# Patient Record
Sex: Female | Born: 1960 | Race: Black or African American | Hispanic: No | Marital: Married | Smoking: Current every day smoker
Health system: Southern US, Community
[De-identification: ages and names within clinical notes are randomized; demographics above are authoritative.]

## PROBLEM LIST (undated history)

## (undated) DIAGNOSIS — I10 Essential (primary) hypertension: Secondary | ICD-10-CM

---

## 2019-12-18 ENCOUNTER — Encounter: Payer: Self-pay | Admitting: Internal Medicine

## 2019-12-18 ENCOUNTER — Inpatient Hospital Stay
Admission: EM | Admit: 2019-12-18 | Discharge: 2019-12-23 | DRG: 872 | Disposition: A | Discharge status: Home / Self Care | Payer: BC Managed Care – PPO | Attending: Family Medicine | Admitting: Family Medicine

## 2019-12-18 ENCOUNTER — Other Ambulatory Visit: Payer: Self-pay

## 2019-12-18 ENCOUNTER — Emergency Department: Payer: BC Managed Care – PPO

## 2019-12-18 DIAGNOSIS — A4151 Sepsis due to Escherichia coli [E. coli]: Secondary | ICD-10-CM | POA: Diagnosis not present

## 2019-12-18 DIAGNOSIS — F17210 Nicotine dependence, cigarettes, uncomplicated: Secondary | ICD-10-CM | POA: Diagnosis present

## 2019-12-18 DIAGNOSIS — Z8249 Family history of ischemic heart disease and other diseases of the circulatory system: Secondary | ICD-10-CM | POA: Diagnosis not present

## 2019-12-18 DIAGNOSIS — Z9049 Acquired absence of other specified parts of digestive tract: Secondary | ICD-10-CM

## 2019-12-18 DIAGNOSIS — I10 Essential (primary) hypertension: Secondary | ICD-10-CM | POA: Diagnosis present

## 2019-12-18 DIAGNOSIS — R6520 Severe sepsis without septic shock: Secondary | ICD-10-CM | POA: Diagnosis present

## 2019-12-18 DIAGNOSIS — A419 Sepsis, unspecified organism: Secondary | ICD-10-CM

## 2019-12-18 DIAGNOSIS — Z79899 Other long term (current) drug therapy: Secondary | ICD-10-CM | POA: Diagnosis not present

## 2019-12-18 DIAGNOSIS — K5900 Constipation, unspecified: Secondary | ICD-10-CM | POA: Diagnosis present

## 2019-12-18 DIAGNOSIS — R000 Tachycardia, unspecified: Secondary | ICD-10-CM

## 2019-12-18 DIAGNOSIS — N12 Tubulo-interstitial nephritis, not specified as acute or chronic: Secondary | ICD-10-CM | POA: Diagnosis not present

## 2019-12-18 DIAGNOSIS — N179 Acute kidney failure, unspecified: Secondary | ICD-10-CM | POA: Diagnosis present

## 2019-12-18 DIAGNOSIS — N390 Urinary tract infection, site not specified: Secondary | ICD-10-CM | POA: Diagnosis not present

## 2019-12-18 DIAGNOSIS — L299 Pruritus, unspecified: Secondary | ICD-10-CM | POA: Diagnosis not present

## 2019-12-18 DIAGNOSIS — Z20822 Contact with and (suspected) exposure to covid-19: Secondary | ICD-10-CM | POA: Diagnosis present

## 2019-12-18 HISTORY — DX: Essential (primary) hypertension: I10

## 2019-12-18 LAB — COMPREHENSIVE METABOLIC PANEL
ALT: 25 U/L (ref 0–44)
AST: 32 U/L (ref 15–41)
Albumin: 4 g/dL (ref 3.5–5.0)
Alkaline Phosphatase: 76 U/L (ref 38–126)
Anion gap: 17 — ABNORMAL HIGH (ref 5–15)
BUN: 13 mg/dL (ref 6–20)
CO2: 22 mmol/L (ref 22–32)
Calcium: 9.6 mg/dL (ref 8.9–10.3)
Chloride: 100 mmol/L (ref 98–111)
Creatinine, Ser: 1.49 mg/dL — ABNORMAL HIGH (ref 0.44–1.00)
GFR calc Af Amer: 45 mL/min — ABNORMAL LOW (ref >60)
GFR calc non Af Amer: 39 mL/min — ABNORMAL LOW (ref >60)
Glucose, Bld: 137 mg/dL — ABNORMAL HIGH (ref 70–99)
Potassium: 3.9 mmol/L (ref 3.5–5.1)
Sodium: 139 mmol/L (ref 135–145)
Total Bilirubin: 1.2 mg/dL (ref 0.3–1.2)
Total Protein: 8.4 g/dL — ABNORMAL HIGH (ref 6.5–8.1)

## 2019-12-18 LAB — LACTIC ACID, PLASMA
Lactic Acid, Venous: 2 mmol/L (ref 0.5–1.9)
Lactic Acid, Venous: 1 mmol/L (ref 0.5–1.9)

## 2019-12-18 LAB — URINALYSIS, COMPLETE (UACMP) WITH MICROSCOPIC
Bilirubin Urine: NEGATIVE
Glucose, UA: NEGATIVE mg/dL
Ketones, ur: NEGATIVE mg/dL
Nitrite: NEGATIVE
Protein, ur: 100 mg/dL — AB
RBC / HPF: >50 RBC/hpf — ABNORMAL HIGH (ref 0–5)
Specific Gravity, Urine: 1.013 (ref 1.005–1.030)
WBC, UA: >50 WBC/hpf — ABNORMAL HIGH (ref 0–5)
pH: 5 (ref 5.0–8.0)

## 2019-12-18 LAB — CBC
HCT: 45.9 % (ref 36.0–46.0)
Hemoglobin: 15.2 g/dL — ABNORMAL HIGH (ref 12.0–15.0)
MCH: 30.4 pg (ref 26.0–34.0)
MCHC: 33.1 g/dL (ref 30.0–36.0)
MCV: 91.8 fL (ref 80.0–100.0)
Platelets: 223 10*3/uL (ref 150–400)
RBC: 5 MIL/uL (ref 3.87–5.11)
RDW: 14 % (ref 11.5–15.5)
WBC: 21.3 10*3/uL — ABNORMAL HIGH (ref 4.0–10.5)
nRBC: 0 % (ref 0.0–0.2)

## 2019-12-18 LAB — LIPASE, BLOOD: Lipase: 21 U/L (ref 11–51)

## 2019-12-18 LAB — SARS CORONAVIRUS 2 BY RT PCR (HOSPITAL ORDER, PERFORMED IN ~~LOC~~ HOSPITAL LAB): SARS Coronavirus 2: NEGATIVE

## 2019-12-18 MED ORDER — SODIUM CHLORIDE 0.9 % IV SOLN: 1.0000 g | INTRAVENOUS | Status: DC

## 2019-12-18 MED ORDER — ACETAMINOPHEN 325 MG PO TABS
650.0000 mg | ORAL_TABLET | Freq: Once | ORAL | Status: AC
  Administered 2019-12-18: 650 mg via ORAL
  Filled 2019-12-18: qty 2

## 2019-12-18 MED ORDER — PHENAZOPYRIDINE HCL 100 MG PO TABS
100.0000 mg | ORAL_TABLET | Freq: Three times a day (TID) | ORAL | Status: DC
  Administered 2019-12-18 – 2019-12-23 (×15): 100 mg via ORAL
  Filled 2019-12-18 (×18): qty 1

## 2019-12-18 MED ORDER — ACETAMINOPHEN 325 MG PO TABS
650.0000 mg | ORAL_TABLET | Freq: Four times a day (QID) | ORAL | Status: DC | PRN
  Administered 2019-12-18 – 2019-12-21 (×7): 650 mg via ORAL
  Filled 2019-12-18 (×9): qty 2

## 2019-12-18 MED ORDER — ONDANSETRON HCL 4 MG/2ML IJ SOLN
4.0000 mg | Freq: Once | INTRAMUSCULAR | Status: AC
  Administered 2019-12-18: 4 mg via INTRAVENOUS
  Filled 2019-12-18: qty 2

## 2019-12-18 MED ORDER — MORPHINE SULFATE (PF) 4 MG/ML IV SOLN
4.0000 mg | Freq: Once | INTRAVENOUS | Status: AC
  Administered 2019-12-18: 4 mg via INTRAVENOUS
  Filled 2019-12-18: qty 1

## 2019-12-18 MED ORDER — ONDANSETRON HCL 4 MG PO TABS: 4.0000 mg | ORAL_TABLET | Freq: Four times a day (QID) | ORAL | Status: DC | PRN

## 2019-12-18 MED ORDER — SODIUM CHLORIDE 0.9 % IV SOLN: INTRAVENOUS | Status: DC

## 2019-12-18 MED ORDER — ACETAMINOPHEN 650 MG RE SUPP: 650.0000 mg | Freq: Four times a day (QID) | RECTAL | Status: DC | PRN

## 2019-12-18 MED ORDER — ONDANSETRON HCL 4 MG/2ML IJ SOLN: 4.0000 mg | Freq: Four times a day (QID) | INTRAMUSCULAR | Status: DC | PRN

## 2019-12-18 MED ORDER — SODIUM CHLORIDE 0.9 % IV BOLUS (SEPSIS)
1000.0000 mL | Freq: Once | INTRAVENOUS | Status: AC
  Administered 2019-12-18: 1000 mL via INTRAVENOUS

## 2019-12-18 MED ORDER — SODIUM CHLORIDE 0.9 % IV SOLN
1.0000 g | INTRAVENOUS | Status: DC
  Administered 2019-12-18: 1 g via INTRAVENOUS
  Filled 2019-12-18: qty 10

## 2019-12-18 MED ORDER — ENOXAPARIN SODIUM 40 MG/0.4ML ~~LOC~~ SOLN
40.0000 mg | SUBCUTANEOUS | Status: DC
  Administered 2019-12-18 – 2019-12-22 (×5): 40 mg via SUBCUTANEOUS
  Filled 2019-12-18 (×5): qty 0.4

## 2019-12-18 NOTE — ED Notes (Signed)
Pt attempted to obtain a second UA but unable to at this time.

## 2019-12-18 NOTE — Progress Notes (Signed)
CODE SEPSIS - PHARMACY COMMUNICATION  **Broad Spectrum Antibiotics should be administered within 1 hour of Sepsis diagnosis**  Time Code Sepsis Called/Page Received: @ 1252   Antibiotics Ordered: Ceftriaxone   Time of 1st antibiotic administration: @ 1307  Additional action taken by pharmacy: N/A   Gardner Candle, PharmD, BCPS Clinical Pharmacist 12/18/2019 1:39 PM

## 2019-12-18 NOTE — ED Notes (Signed)
Pt taken to CT.

## 2019-12-18 NOTE — ED Triage Notes (Signed)
Pt states that she has been having burning with urination since Thursday, states that she has also been having difficulty having a bm and is now having pain in her left lower abd.

## 2019-12-18 NOTE — ED Notes (Signed)
EDP at bedside  

## 2019-12-18 NOTE — H&P (Signed)
History and Physical    Anita Best WUJ:[id number] DOB: 10-28-1960 DOA: 12/18/2019  PCP: Fannie Knee, MD   Patient coming from: Home  I have personally briefly reviewed patient's old medical records in [facility]  Chief Complaint: Dysuria  HPI: Anita Best is a 59 y.o. female with medical history significant for hypertension and borderline diabetes mellitus who presents to the ER for evaluation of a 3-day history of dysuria, difficulty voiding, weakness, fever and nausea.  Patient stated that she did started having urinary frequency about 3 days prior to her presentation over the last 24 hours developed severe dysuria with inability to void.  She also complains of fever and chills but did not check a temp at home.  She states that she is unable to void small amounts due to the discomfort. She also complains of suprapubic pain which she rates a 5 x 10 in intensity at its worst.  Pain is nonradiating.  She complains of constipation but denies having any chest pain, no shortness of breath, no headache, no visual changes, no diaphoresis or palpitations. Vital signs show T-max 103.3 F, tachycardia with pulse rate of 129, blood pressure 108/59 Labs reveal sodium of 139, potassium 3.9, bicarb 22, glucose 137, BUN of 13, creatinine of 1.49, white count 21.3, hemoglobin 15.2, platelet count 223.  Lactic acid was 2.1 >> 1.0 CT renal shows the bladder is thick walled with adjacent stranding suggesting significant cystitis.  There is stranding in the left upper quadrant, thought to arise from the left kidney. There is no evidence of obstruction. This could represent sequela of underlying infection/pyelonephritis. The finding could also be seen with a recently passed stone although there is no hydronephrosis on today's study.      ED Course: Patient is a 59 year old with a history of hypertension who presents to the ER for evaluation of dysuria, frequency, suprapubic pain associated with  fever and chills.  Patient appears septic and received sepsis fluid bolus in the ER as well as a dose of Rocephin.  She will be admitted to the hospital for further evaluation.  Review of Systems: As per HPI otherwise 10 point review of systems negative.    Past Medical History:  Diagnosis Date  . Hypertension       reports that she has been smoking cigarettes. She has a 2.50 pack-year smoking history. She does not have any smokeless tobacco history on file. She reports previous alcohol use. No history on file for drug use.  No Known Allergies  Family History  Problem Relation Age of Onset  . Other Mother   . Hypertension Mother   . Hypertension Father      Prior to Admission medications   Medication Sig Start Date End Date Taking? Authorizing Provider  amLODipine (NORVASC) 5 MG tablet Take 5 mg by mouth daily. 12/07/19  Yes [provider]  lisinopril (ZESTRIL) 40 MG tablet Take 40 mg by mouth daily. 12/07/19  Yes [provider]  Multiple Vitamins-Minerals (MULTIVITAMIN WITH MINERALS) tablet Take 1 tablet by mouth daily.   Yes [provider]    Physical Exam: Vitals:   12/18/19 1057 12/18/19 1123 12/18/19 1457  BP: 120/72  (!) 108/59  Pulse: (!) 129  (!) 105  Resp: 19  16  Temp: (!) 103.3 F (39.6 C)  100.2 F (37.9 C)  TempSrc: Oral  Oral  SpO2: 97%  94%  Weight:  104.3 kg   Height:  5\' 6"  (1.676 m)  Vitals:   12/18/19 1057 12/18/19 1123 12/18/19 1457  BP: 120/72  (!) 108/59  Pulse: (!) 129  (!) 105  Resp: 19  16  Temp: (!) 103.3 F (39.6 C)  100.2 F (37.9 C)  TempSrc: Oral  Oral  SpO2: 97%  94%  Weight:  104.3 kg   Height:  5\' 6"  (1.676 m)     Constitutional: NAD, alert and oriented x 3.  Acutely ill-appearing Eyes: PERRL, lids and conjunctivae normal ENMT: Mucous membranes are dry  Neck: normal, supple, no masses, no thyromegaly Respiratory: clear to auscultation bilaterally, no wheezing, no crackles. Normal  respiratory effort. No accessory muscle use.  Cardiovascular: Tachycardic, no murmurs / rubs / gallops. No extremity edema. 2+ pedal pulses. No carotid bruits.  Abdomen: Suprapubic tenderness, no masses palpated. No hepatosplenomegaly. Bowel sounds positive.  Musculoskeletal: no clubbing / cyanosis. No joint deformity upper and lower extremities.  Skin: no rashes, lesions, ulcers.  Neurologic: No gross focal neurologic deficit. Psychiatric: Normal mood and affect.   Labs on Admission: I have personally reviewed following labs and imaging studies  CBC: Recent Labs  Lab 12/18/19 1139  WBC 21.3*  HGB 15.2*  HCT 45.9  MCV 91.8  PLT 223   Basic Metabolic Panel: Recent Labs  Lab 12/18/19 1139  NA 139  K 3.9  CL 100  CO2 22  GLUCOSE 137*  BUN 13  CREATININE 1.49*  CALCIUM 9.6   GFR: Estimated Creatinine Clearance: 50.8 mL/min (A) (by C-G formula based on SCr of 1.49 mg/dL (H)). Liver Function Tests: Recent Labs  Lab 12/18/19 1139  AST 32  ALT 25  ALKPHOS 76  BILITOT 1.2  PROT 8.4*  ALBUMIN 4.0   Recent Labs  Lab 12/18/19 1139  LIPASE 21   No results for input(s): AMMONIA in the last 168 hours. Coagulation Profile: No results for input(s): INR, PROTIME in the last 168 hours. Cardiac Enzymes: No results for input(s): CKTOTAL, CKMB, CKMBINDEX, TROPONINI in the last 168 hours. BNP (last 3 results) No results for input(s): PROBNP in the last 8760 hours. HbA1C: No results for input(s): HGBA1C in the last 72 hours. CBG: No results for input(s): GLUCAP in the last 168 hours. Lipid Profile: No results for input(s): CHOL, HDL, LDLCALC, TRIG, CHOLHDL, LDLDIRECT in the last 72 hours. Thyroid Function Tests: No results for input(s): TSH, T4TOTAL, FREET4, T3FREE, THYROIDAB in the last 72 hours. Anemia Panel: No results for input(s): VITAMINB12, FOLATE, FERRITIN, TIBC, IRON, RETICCTPCT in the last 72 hours. Urine analysis: No results found for: COLORURINE,  APPEARANCEUR, LABSPEC, PHURINE, GLUCOSEU, HGBUR, BILIRUBINUR, KETONESUR, PROTEINUR, UROBILINOGEN, NITRITE, LEUKOCYTESUR  Radiological Exams on Admission: CT Renal Stone Study  Result Date: 12/18/2019 CLINICAL DATA:  Left flank and pelvic pain.  Dysuria. EXAM: CT ABDOMEN AND PELVIS WITHOUT CONTRAST TECHNIQUE: Multidetector CT imaging of the abdomen and pelvis was performed following the standard protocol without IV contrast. COMPARISON:  None. FINDINGS: Lower chest: No acute abnormality. Hepatobiliary: No focal liver abnormality is seen. Status post cholecystectomy. No biliary dilatation. Pancreas: Unremarkable. No pancreatic ductal dilatation or surrounding inflammatory changes. Spleen: Normal in size without focal abnormality. Adrenals/Urinary Tract: The adrenal glands are normal. There is fat stranding in the left upper quadrant, likely arising from the left kidney. This finding is asymmetric to the right. No renal stones or hydronephrosis. The ureters are symmetric in caliber. No ureteral stones are identified. The bladder is poorly distended but appears thick walled with adjacent fat stranding. Stomach/Bowel: There is a fat containing mass  in the distal gastric body, likely a lipoma. The stomach is otherwise normal. The small bowel is unremarkable. Scattered colonic diverticuli are seen without diverticulitis. The appendix is normal in appearance. Vascular/Lymphatic: Calcified atherosclerosis in the nonaneurysmal aorta. No adenopathy. Reproductive: Numerous calcified fibroids in the uterus. No adnexal masses. Other: No abdominal wall hernia or abnormality. No abdominopelvic ascites. Musculoskeletal: No acute or significant osseous findings. IMPRESSION: 1. The bladder is thick walled with adjacent stranding suggesting significant cystitis. Recommend clinical correlation and correlation with urinalysis. 2. There is stranding in the left upper quadrant, thought to arise from the left kidney. There is no  evidence of obstruction. This could represent sequela of underlying infection/pyelonephritis. The finding could also be seen with a recently passed stone although there is no hydronephrosis on today's study. 3. Colonic diverticulosis without diverticulitis. 4. Calcified atherosclerosis in the nonaneurysmal aorta. 5. Fibroid uterus. 6. 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 Electronically Signed   By: Gerome Sam III M.D   On: 12/18/2019 14:09    EKG: Independently reviewed.   Assessment/Plan Principal Problem:   Sepsis (HCC) Active Problems:   Acute lower UTI   Essential hypertension    Sepsis from urinary source (POA) Patient presents to the ER for evaluation of a 3-day history of frequency of urination, dysuria with fever and chills She had a T-max of 103.32F, she was tachycardic, she has a white cell count of 21,000 and pyuria (UA is pending but urine appeared thick and milky in the ER, patient only able to void small amounts at a time due to significant dysuria), lactic acid of 2.1 Aggressive IV fluid resuscitation Place patient on Rocephin 1 g IV daily while awaiting results of urine culture We will place patient on phenazopyridine Follow-up results of blood and urine culture   Hypertension Patient is normotensive Hold all antihypertensive medications for now   DVT prophylaxis: Lovenox Code Status: Full code Family Communication: Greater than 50% of time was spent discussing plan of care with patient at the bedside.  She verbalizes understanding and agrees with the plan. Disposition Plan: Back to previous home environment Consults called: None    Franca Stakes MD [facility]     12/18/2019, 3:32 PM

## 2019-12-18 NOTE — ED Provider Notes (Signed)
[facility] Emergency Department Provider Note   ____________________________________________    I have reviewed the triage vital signs and the nursing notes.   HISTORY  Chief Complaint Dysuria     HPI Anita Best is a 59 y.o. female who presents with complaints of dysuria, fever, weakness, nausea.  Patient reports she has been having urinary frequency and developed dysuria that was moderate to severe yesterday.  Today she felt worse and felt chills as well.  She denies flank pain.  She does have some discomfort when urinating and occasionally has only able to urinate small amounts.  No history of kidney stones.  Nothing seems to make it better.  Past medical history  history of cholecystectomy     Prior to Admission medications   Not on File     Allergies Patient has no known allergies.  Family history noncontributory Social History Does not smoke, does not drink EtOH Review of Systems  Constitutional: Positive chills Eyes: No visual changes.  ENT: No sore throat. Cardiovascular: Denies chest pain. Respiratory: Denies shortness of breath. Gastrointestinal: As above Genitourinary: As above Musculoskeletal: Negative for back pain. Skin: Negative for rash. Neurological: Negative for headaches or weakness   ____________________________________________   PHYSICAL EXAM:  VITAL SIGNS: ED Triage Vitals  Enc Vitals Group     BP 12/18/19 1057 120/72     Pulse Rate 12/18/19 1057 (!) 129     Resp 12/18/19 1057 19     Temp 12/18/19 1057 (!) 103.3 F (39.6 C)     Temp Source 12/18/19 1057 Oral     SpO2 12/18/19 1057 97 %     Weight 12/18/19 1123 104.3 kg (230 lb)     Height 12/18/19 1123 1.676 m (5\' 6" )     Head Circumference --      Peak Flow --      Pain Score 12/18/19 1122 7     Pain Loc --      Pain Edu? --      Excl. in GC? --     Constitutional: Alert and oriented. No acute distress.   Nose: No  congestion/rhinnorhea.  Cardiovascular: Tachycardia, regular rhythm. Grossly normal heart sounds.  Good peripheral circulation. Respiratory: Normal respiratory effort.  No retractions. Lungs CTAB. Gastrointestinal: Soft and nontender. No distention.  No CVA tenderness.  Reassuring exam Genitourinary: deferred Musculoskeletal:   Warm and well perfused Neurologic:  Normal speech and language. No gross focal neurologic deficits are appreciated.  Skin:  Skin is warm, dry and intact. No rash noted. Psychiatric: Mood and affect are normal. Speech and behavior are normal.  ____________________________________________   LABS (all labs ordered are listed, but only abnormal results are displayed)  Labs Reviewed  COMPREHENSIVE METABOLIC PANEL - Abnormal; Notable for the following components:      Result Value   Glucose, Bld 137 (*)    Creatinine, Ser 1.49 (*)    Total Protein 8.4 (*)    GFR calc non Af Amer 39 (*)    GFR calc Af Amer 45 (*)    Anion gap 17 (*)    All other components within normal limits  CBC - Abnormal; Notable for the following components:   WBC 21.3 (*)    Hemoglobin 15.2 (*)    All other components within normal limits  LACTIC ACID, PLASMA - Abnormal; Notable for the following components:   Lactic Acid, Venous 2.0 (*)    All other components within normal limits  CULTURE, BLOOD (  ROUTINE X 2)  CULTURE, BLOOD (ROUTINE X 2)  URINE CULTURE  SARS CORONAVIRUS 2 BY RT PCR (HOSPITAL ORDER, PERFORMED IN [facility])  LIPASE, BLOOD  URINALYSIS, ROUTINE W REFLEX MICROSCOPIC  LACTIC ACID, PLASMA  URINALYSIS, COMPLETE (UACMP) WITH MICROSCOPIC   ____________________________________________  EKG None ____________________________________________  RADIOLOGY  CT renal stone study performed to rule out kidney stone ____________________________________________   PROCEDURES  Procedure(s) performed: No  Procedures   Critical Care performed:  No ____________________________________________   INITIAL IMPRESSION / ASSESSMENT AND PLAN / ED COURSE  Pertinent labs & imaging results that were available during my care of the patient were reviewed by me and considered in my medical decision making (see chart for details).  Patient presents febrile, tachycardic, normal blood pressure with complaints of dysuria.  Symptoms are suspicious for urinary tract infection, likely sepsis.  Code sepsis activated upon seeing the patient  Lab work is notable for elevated white blood cell count of 21.3 consistent with sepsis.  Lactic acid is 2.  Lipase is normal.  I have ordered IV fluids, IV morphine, IV Zofran, IV Rocephin  Patient is not diabetic.  CT scan ordered to rule out infected kidney stone.  CT scan negative for ureterolithiasis, some stranding around the left kidney suspicious for pyelonephritis.  Still attempting to get a urine from patient, she is having significant difficulty urinating, IV fluids infusing.  I discussed with the hospitalist for admission    ____________________________________________   FINAL CLINICAL IMPRESSION(S) / ED DIAGNOSES  Final diagnoses:  Sepsis without acute organ dysfunction, due to unspecified organism [facility])  Pyelonephritis        Note:  This document was prepared using Dragon voice recognition software and may include unintentional dictation errors.   Jene Every, MD 12/18/19 [phone]

## 2019-12-19 ENCOUNTER — Encounter: Payer: Self-pay | Admitting: Internal Medicine

## 2019-12-19 DIAGNOSIS — A4151 Sepsis due to Escherichia coli [E. coli]: Principal | ICD-10-CM

## 2019-12-19 DIAGNOSIS — R6520 Severe sepsis without septic shock: Secondary | ICD-10-CM

## 2019-12-19 DIAGNOSIS — N179 Acute kidney failure, unspecified: Secondary | ICD-10-CM | POA: Insufficient documentation

## 2019-12-19 LAB — BLOOD CULTURE ID PANEL (REFLEXED) - BCID2

## 2019-12-19 LAB — CBC
HCT: 40.1 % (ref 36.0–46.0)
Hemoglobin: 13.2 g/dL (ref 12.0–15.0)
MCH: 30.1 pg (ref 26.0–34.0)
MCHC: 32.9 g/dL (ref 30.0–36.0)
MCV: 91.6 fL (ref 80.0–100.0)
Platelets: 195 10*3/uL (ref 150–400)
RBC: 4.38 MIL/uL (ref 3.87–5.11)
RDW: 14 % (ref 11.5–15.5)
WBC: 21.5 10*3/uL — ABNORMAL HIGH (ref 4.0–10.5)
nRBC: 0 % (ref 0.0–0.2)

## 2019-12-19 LAB — PROTIME-INR
INR: 1.2 (ref 0.8–1.2)
Prothrombin Time: 14.6 seconds (ref 11.4–15.2)

## 2019-12-19 LAB — BASIC METABOLIC PANEL
Anion gap: 11 (ref 5–15)
BUN: 17 mg/dL (ref 6–20)
CO2: 21 mmol/L — ABNORMAL LOW (ref 22–32)
Calcium: 8.6 mg/dL — ABNORMAL LOW (ref 8.9–10.3)
Chloride: 105 mmol/L (ref 98–111)
Creatinine, Ser: 1.38 mg/dL — ABNORMAL HIGH (ref 0.44–1.00)
GFR calc Af Amer: 49 mL/min — ABNORMAL LOW (ref >60)
GFR calc non Af Amer: 42 mL/min — ABNORMAL LOW (ref >60)
Glucose, Bld: 135 mg/dL — ABNORMAL HIGH (ref 70–99)
Potassium: 3.5 mmol/L (ref 3.5–5.1)
Sodium: 137 mmol/L (ref 135–145)

## 2019-12-19 LAB — HIV ANTIBODY (ROUTINE TESTING W REFLEX): HIV Screen 4th Generation wRfx: NONREACTIVE

## 2019-12-19 LAB — PROCALCITONIN: Procalcitonin: 4.3 ng/mL

## 2019-12-19 LAB — CORTISOL-AM, BLOOD: Cortisol - AM: 24.8 ug/dL — ABNORMAL HIGH (ref 6.7–22.6)

## 2019-12-19 MED ORDER — HYDROCODONE-ACETAMINOPHEN 5-325 MG PO TABS
1.0000 | ORAL_TABLET | ORAL | Status: DC | PRN
  Administered 2019-12-19: 1 via ORAL
  Filled 2019-12-19: qty 1

## 2019-12-19 MED ORDER — SODIUM CHLORIDE 0.9 % IV SOLN: 2.0000 g | INTRAVENOUS | Status: DC

## 2019-12-19 MED ORDER — MORPHINE SULFATE (PF) 2 MG/ML IV SOLN: 2.0000 mg | INTRAVENOUS | Status: DC | PRN

## 2019-12-19 MED ORDER — AMLODIPINE BESYLATE 5 MG PO TABS
5.0000 mg | ORAL_TABLET | Freq: Every day | ORAL | Status: DC
  Administered 2019-12-19 – 2019-12-20 (×2): 5 mg via ORAL
  Filled 2019-12-19 (×2): qty 1

## 2019-12-19 MED ORDER — IPRATROPIUM-ALBUTEROL 0.5-2.5 (3) MG/3ML IN SOLN
RESPIRATORY_TRACT | Status: AC
  Filled 2019-12-19: qty 9

## 2019-12-19 MED ORDER — SODIUM CHLORIDE 0.9 % IV SOLN
2.0000 g | INTRAVENOUS | Status: DC
  Administered 2019-12-19 – 2019-12-23 (×5): 2 g via INTRAVENOUS
  Filled 2019-12-19: qty 20
  Filled 2019-12-19: qty 2
  Filled 2019-12-19: qty 20
  Filled 2019-12-19 (×2): qty 2

## 2019-12-19 NOTE — Progress Notes (Signed)
Patient ID: Anita Best, female   DOB: Nov 13, 1960, 59 y.o.   MRN: [id number] [facility] PROGRESS NOTE  Anita Best TDV:[id number] DOB: Jun 19, 1960 DOA: 12/18/2019 PCP: Anita Knee, MD  HPI/Subjective: Patient feeling better than when she came in. Had abdominal pain in the front and burning on urination. She had some chills prior to coming in. Decreased appetite and just not feeling well. Blood cultures positive for E. coli.  Objective: Vitals:   12/19/19 0911 12/19/19 0919  BP: 117/69   Pulse:    Resp:  17  Temp:    SpO2:      Intake/Output Summary (Last 24 hours) at 12/19/2019 1248 Last data filed at 12/19/2019 1030 Gross per 24 hour  Intake 1200 ml  Output --  Net 1200 ml   Filed Weights   12/18/19 1123  Weight: 104.3 kg    ROS: Review of Systems  Constitutional: Positive for chills.  Respiratory: Negative for shortness of breath.   Cardiovascular: Negative for chest pain.  Gastrointestinal: Positive for abdominal pain. Negative for diarrhea, nausea and vomiting.  Genitourinary: Positive for dysuria.   Exam: Physical Exam HENT:     Nose: No mucosal edema.     Mouth/Throat:     Pharynx: No oropharyngeal exudate.  Eyes:     General: Lids are normal.     Conjunctiva/sclera: Conjunctivae normal.     Pupils: Pupils are equal, round, and reactive to light.  Cardiovascular:     Rate and Rhythm: Regular rhythm. Tachycardia present.     Heart sounds: Normal heart sounds, S1 normal and S2 normal.  Pulmonary:     Breath sounds: No decreased breath sounds, wheezing, rhonchi or rales.  Abdominal:     Palpations: Abdomen is soft.     Tenderness: There is abdominal tenderness in the suprapubic area.  Musculoskeletal:     Right ankle: No swelling.     Left ankle: No swelling.  Skin:    General: Skin is warm.     Findings: No rash.  Neurological:     Mental Status: She is alert and oriented to person, place, and time.       Data Reviewed: Basic  Metabolic Panel: Recent Labs  Lab 12/18/19 1139 12/19/19 0727  NA 139 137  K 3.9 3.5  CL 100 105  CO2 22 21*  GLUCOSE 137* 135*  BUN 13 17  CREATININE 1.49* 1.38*  CALCIUM 9.6 8.6*   Liver Function Tests: Recent Labs  Lab 12/18/19 1139  AST 32  ALT 25  ALKPHOS 76  BILITOT 1.2  PROT 8.4*  ALBUMIN 4.0   Recent Labs  Lab 12/18/19 1139  LIPASE 21   CBC: Recent Labs  Lab 12/18/19 1139 12/19/19 0727  WBC 21.3* 21.5*  HGB 15.2* 13.2  HCT 45.9 40.1  MCV 91.8 91.6  PLT 223 195     Recent Results (from the past 240 hour(s))  Culture, blood (routine x 2)     Status: None (Preliminary result)   Collection Time: 12/18/19 11:39 AM   Specimen: BLOOD  Result Value Ref Range Status   Specimen Description BLOOD BLOOD RIGHT HAND  Final   Special Requests   Final    BOTTLES DRAWN AEROBIC AND ANAEROBIC Blood Culture results may not be optimal due to an inadequate volume of blood received in culture bottles   Culture   Final    NO GROWTH < 24 HOURS Performed at [facility], [address], [place], [place] [address]  Report Status PENDING  Incomplete  Culture, blood (routine x 2)     Status: None (Preliminary result)   Collection Time: 12/18/19 11:39 AM   Specimen: BLOOD  Result Value Ref Range Status   Specimen Description BLOOD BLOOD LEFT FOREARM  Final   Special Requests   Final    BOTTLES DRAWN AEROBIC AND ANAEROBIC Blood Culture results may not be optimal due to an excessive volume of blood received in culture bottles   Culture  Setup Time   Final    Organism ID to follow GRAM NEGATIVE RODS IN BOTH AEROBIC AND ANAEROBIC BOTTLES CRITICAL RESULT CALLED TO, READ BACK BY AND VERIFIED WITH: SCOTT HALL 12/18/19 AT 2359 HS Performed at [facility], [address] [place], [place] [address]    Culture GRAM NEGATIVE RODS  Final   Report Status PENDING  Incomplete  Blood Culture ID Panel (Reflexed)     Status: Abnormal   Collection Time: 12/18/19  11:39 AM  Result Value Ref Range Status   Enterococcus faecalis NOT DETECTED NOT DETECTED Final   Enterococcus Faecium NOT DETECTED NOT DETECTED Final   Listeria monocytogenes NOT DETECTED NOT DETECTED Final   Staphylococcus species NOT DETECTED NOT DETECTED Final   Staphylococcus aureus (BCID) NOT DETECTED NOT DETECTED Final   Staphylococcus epidermidis NOT DETECTED NOT DETECTED Final   Staphylococcus lugdunensis NOT DETECTED NOT DETECTED Final   Streptococcus species NOT DETECTED NOT DETECTED Final   Streptococcus agalactiae NOT DETECTED NOT DETECTED Final   Streptococcus pneumoniae NOT DETECTED NOT DETECTED Final   Streptococcus pyogenes NOT DETECTED NOT DETECTED Final   A.calcoaceticus-baumannii NOT DETECTED NOT DETECTED Final   Bacteroides fragilis NOT DETECTED NOT DETECTED Final   Enterobacterales DETECTED (A) NOT DETECTED Final    Comment: Enterobacterales represent a large order of gram negative bacteria, not a single organism. CRITICAL RESULT CALLED TO, READ BACK BY AND VERIFIED WITH: SCOTT HALL 12/18/19 AT 2359 HS    Enterobacter cloacae complex NOT DETECTED NOT DETECTED Final   Escherichia coli DETECTED (A) NOT DETECTED Final    Comment: CRITICAL RESULT CALLED TO, READ BACK BY AND VERIFIED WITH: SCOTT HALL 12/18/19 AT 2359 HS    Klebsiella aerogenes NOT DETECTED NOT DETECTED Final   Klebsiella oxytoca NOT DETECTED NOT DETECTED Final   Klebsiella pneumoniae NOT DETECTED NOT DETECTED Final   Proteus species NOT DETECTED NOT DETECTED Final   Salmonella species NOT DETECTED NOT DETECTED Final   Serratia marcescens NOT DETECTED NOT DETECTED Final   Haemophilus influenzae NOT DETECTED NOT DETECTED Final   Neisseria meningitidis NOT DETECTED NOT DETECTED Final   Pseudomonas aeruginosa NOT DETECTED NOT DETECTED Final   Stenotrophomonas maltophilia NOT DETECTED NOT DETECTED Final   Candida albicans NOT DETECTED NOT DETECTED Final   Candida auris NOT DETECTED NOT DETECTED Final    Candida glabrata NOT DETECTED NOT DETECTED Final   Candida krusei NOT DETECTED NOT DETECTED Final   Candida parapsilosis NOT DETECTED NOT DETECTED Final   Candida tropicalis NOT DETECTED NOT DETECTED Final   Cryptococcus neoformans/gattii NOT DETECTED NOT DETECTED Final   CTX-M ESBL NOT DETECTED NOT DETECTED Final   Carbapenem resistance IMP NOT DETECTED NOT DETECTED Final   Carbapenem resistance KPC NOT DETECTED NOT DETECTED Final   Carbapenem resistance NDM NOT DETECTED NOT DETECTED Final   Carbapenem resist OXA 48 LIKE NOT DETECTED NOT DETECTED Final   Carbapenem resistance VIM NOT DETECTED NOT DETECTED Final    Comment: Performed at [facility], [address] [place], [place] [address]  SARS Coronavirus 2 by RT PCR (hospital order, performed in [facility] hospital lab) Nasopharyngeal Nasopharyngeal Swab     Status: None   Collection Time: 12/18/19  3:00 PM   Specimen: Nasopharyngeal Swab  Result Value Ref Range Status   SARS Coronavirus 2 NEGATIVE NEGATIVE Final    Comment: (NOTE) SARS-CoV-2 target nucleic acids are NOT DETECTED.  The SARS-CoV-2 RNA is generally detectable in upper and lower respiratory specimens during the acute phase of infection. The lowest concentration of SARS-CoV-2 viral copies this assay can detect is 250 copies / mL. A negative result does not preclude SARS-CoV-2 infection and should not be used as the sole basis for treatment or other patient management decisions.  A negative result may occur with improper specimen collection / handling, submission of specimen other than nasopharyngeal swab, presence of viral mutation(s) within the areas targeted by this assay, and inadequate number of viral copies (<250 copies / mL). A negative result must be combined with clinical observations, patient history, and epidemiological information.  Fact Sheet for Patients:   BoilerBrush.com.cyhttps://www.fda.gov/media/136312/download  Fact Sheet for Healthcare  Providers: https://pope.com/https://www.fda.gov/media/136313/download  This test is not yet approved or  cleared by the [place] FDA and has been authorized for detection and/or diagnosis of SARS-CoV-2 by FDA under an Emergency Use Authorization (EUA).  This EUA will remain in effect (meaning this test can be used) for the duration of the COVID-19 declaration under Section 564(b)(1) of the Act, 21 U.S.C. section 360bbb-3(b)(1), unless the authorization is terminated or revoked sooner.  Performed at [facility], [address] [place], [place] [address]      Studies: CT Renal Stone Study  Result Date: 12/18/2019 CLINICAL DATA:  Left flank and pelvic pain.  Dysuria. EXAM: CT ABDOMEN AND PELVIS WITHOUT CONTRAST TECHNIQUE: Multidetector CT imaging of the abdomen and pelvis was performed following the standard protocol without IV contrast. COMPARISON:  None. FINDINGS: Lower chest: No acute abnormality. Hepatobiliary: No focal liver abnormality is seen. Status post cholecystectomy. No biliary dilatation. Pancreas: Unremarkable. No pancreatic ductal dilatation or surrounding inflammatory changes. Spleen: Normal in size without focal abnormality. Adrenals/Urinary Tract: The adrenal glands are normal. There is fat stranding in the left upper quadrant, likely arising from the left kidney. This finding is asymmetric to the right. No renal stones or hydronephrosis. The ureters are symmetric in caliber. No ureteral stones are identified. The bladder is poorly distended but appears thick walled with adjacent fat stranding. Stomach/Bowel: There is a fat containing mass in the distal gastric body, likely a lipoma. The stomach is otherwise normal. The small bowel is unremarkable. Scattered colonic diverticuli are seen without diverticulitis. The appendix is normal in appearance. Vascular/Lymphatic: Calcified atherosclerosis in the nonaneurysmal aorta. No adenopathy. Reproductive: Numerous calcified fibroids in the  uterus. No adnexal masses. Other: No abdominal wall hernia or abnormality. No abdominopelvic ascites. Musculoskeletal: No acute or significant osseous findings. IMPRESSION: 1. The bladder is thick walled with adjacent stranding suggesting significant cystitis. Recommend clinical correlation and correlation with urinalysis. 2. There is stranding in the left upper quadrant, thought to arise from the left kidney. There is no evidence of obstruction. This could represent sequela of underlying infection/pyelonephritis. The finding could also be seen with a recently passed stone although there is no hydronephrosis on today's study. 3. Colonic diverticulosis without diverticulitis. 4. Calcified atherosclerosis in the nonaneurysmal aorta. 5. Fibroid uterus. 6. 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 Electronically Signed  By: Gerome Sam III M.D   On: 12/18/2019 14:09    Scheduled Meds: . amLODipine  5 mg Oral Daily  . enoxaparin (LOVENOX) injection  40 mg Subcutaneous Q24H  . phenazopyridine  100 mg Oral TID WC   Continuous Infusions: . sodium chloride 75 mL/hr at 12/19/19 1239  . cefTRIAXone (ROCEPHIN)  IV Stopped (12/19/19 1030)    Assessment/Plan:  1. Severe sepsis present on admission with acute kidney injury. E. coli and blood cultures. Urine analysis also positive. Patient with fever, tachypnea and tachycardia. IV fluid hydration. IV Rocephin. 2. Acute kidney injury creatinine 1.49 on presentation. Looking back at care everywhere last year creatinine was 0.7. 3. Essential hypertension can restart Norvasc but hold lisinopril  Code Status:     Code Status Orders  (From admission, onward)         Start     Ordered   12/18/19 1436  Full code  Continuous        12/18/19 1437        Code Status History    This patient has a current code status but no historical code status.   Advance Care Planning Activity     Family Communication: Patient deferred  me calling family Disposition Plan: Status is: Inpatient  Dispo: The patient is from: Home              Anticipated d/c is to: Home.              Anticipated d/c date is: Once I get sensitivities back on blood cultures and urine cultures likely can make a disposition home.              Patient currently being treated with IV antibiotics for severe sepsis  Antibiotics:  Rocephin  Time spent: 28 minutes  [employer]

## 2019-12-19 NOTE — ED Notes (Signed)
Pt's IV accidentally came out when she was up to use bathroom- will attempt new access

## 2019-12-19 NOTE — ED Notes (Signed)
Pt given ginger ale.

## 2019-12-19 NOTE — ED Notes (Signed)
Lab at bedside

## 2019-12-19 NOTE — Progress Notes (Signed)
PHARMACY - PHYSICIAN COMMUNICATION CRITICAL VALUE ALERT - BLOOD CULTURE IDENTIFICATION (BCID)  Anita Best is an 59 y.o. female who presented to [facility] on 12/18/2019 with a chief complaint of dysuria  Assessment:  Lab reports 2 of 4 bottles w/ GNR, E Coli  Name of physician (or Provider) Contacted: Reyes Ivan NP  Current antibiotics: Rocephin 1gm  Changes to prescribed antibiotics recommended: increase Rocephin to 2gm IV q24hrs Recommendations accepted by provider  Results for orders placed or performed during the hospital encounter of 12/18/19  Blood Culture ID Panel (Reflexed) (Collected: 12/18/2019 11:39 AM)  Result Value Ref Range   Enterococcus faecalis NOT DETECTED NOT DETECTED   Enterococcus Faecium NOT DETECTED NOT DETECTED   Listeria monocytogenes NOT DETECTED NOT DETECTED   Staphylococcus species NOT DETECTED NOT DETECTED   Staphylococcus aureus (BCID) NOT DETECTED NOT DETECTED   Staphylococcus epidermidis NOT DETECTED NOT DETECTED   Staphylococcus lugdunensis NOT DETECTED NOT DETECTED   Streptococcus species NOT DETECTED NOT DETECTED   Streptococcus agalactiae NOT DETECTED NOT DETECTED   Streptococcus pneumoniae NOT DETECTED NOT DETECTED   Streptococcus pyogenes NOT DETECTED NOT DETECTED   A.calcoaceticus-baumannii NOT DETECTED NOT DETECTED   Bacteroides fragilis NOT DETECTED NOT DETECTED   Enterobacterales DETECTED (A) NOT DETECTED   Enterobacter cloacae complex NOT DETECTED NOT DETECTED   Escherichia coli DETECTED (A) NOT DETECTED   Klebsiella aerogenes NOT DETECTED NOT DETECTED   Klebsiella oxytoca NOT DETECTED NOT DETECTED   Klebsiella pneumoniae NOT DETECTED NOT DETECTED   Proteus species NOT DETECTED NOT DETECTED   Salmonella species NOT DETECTED NOT DETECTED   Serratia marcescens NOT DETECTED NOT DETECTED   Haemophilus influenzae NOT DETECTED NOT DETECTED   Neisseria meningitidis NOT DETECTED NOT DETECTED   Pseudomonas aeruginosa NOT DETECTED NOT DETECTED    Stenotrophomonas maltophilia NOT DETECTED NOT DETECTED   Candida albicans NOT DETECTED NOT DETECTED   Candida auris NOT DETECTED NOT DETECTED   Candida glabrata NOT DETECTED NOT DETECTED   Candida krusei NOT DETECTED NOT DETECTED   Candida parapsilosis NOT DETECTED NOT DETECTED   Candida tropicalis NOT DETECTED NOT DETECTED   Cryptococcus neoformans/gattii NOT DETECTED NOT DETECTED   CTX-M ESBL NOT DETECTED NOT DETECTED   Carbapenem resistance IMP NOT DETECTED NOT DETECTED   Carbapenem resistance KPC NOT DETECTED NOT DETECTED   Carbapenem resistance NDM NOT DETECTED NOT DETECTED   Carbapenem resist OXA 48 LIKE NOT DETECTED NOT DETECTED   Carbapenem resistance VIM NOT DETECTED NOT DETECTED    [name] A 12/19/2019  1:59 AM

## 2019-12-19 NOTE — ED Notes (Signed)
Report given to Jack, RN

## 2019-12-19 NOTE — ED Notes (Signed)
Pt ambulated to restroom, noted tachycardia in the 130's when ambulating. Pt requested to sit at bedside chair. Requesting pain med for abdominal discomfort. Call bell within reach.

## 2019-12-20 DIAGNOSIS — A4151 Sepsis due to Escherichia coli [E. coli]: Secondary | ICD-10-CM

## 2019-12-20 DIAGNOSIS — N179 Acute kidney failure, unspecified: Secondary | ICD-10-CM

## 2019-12-20 DIAGNOSIS — L299 Pruritus, unspecified: Secondary | ICD-10-CM

## 2019-12-20 LAB — BASIC METABOLIC PANEL
Anion gap: 13 (ref 5–15)
BUN: 12 mg/dL (ref 6–20)
CO2: 21 mmol/L — ABNORMAL LOW (ref 22–32)
Calcium: 8.6 mg/dL — ABNORMAL LOW (ref 8.9–10.3)
Chloride: 103 mmol/L (ref 98–111)
Creatinine, Ser: 0.99 mg/dL (ref 0.44–1.00)
GFR calc Af Amer: >60 mL/min (ref >60)
GFR calc non Af Amer: >60 mL/min (ref >60)
Glucose, Bld: 114 mg/dL — ABNORMAL HIGH (ref 70–99)
Potassium: 3.9 mmol/L (ref 3.5–5.1)
Sodium: 137 mmol/L (ref 135–145)

## 2019-12-20 LAB — CBC
HCT: 37.7 % (ref 36.0–46.0)
Hemoglobin: 13 g/dL (ref 12.0–15.0)
MCH: 30.6 pg (ref 26.0–34.0)
MCHC: 34.5 g/dL (ref 30.0–36.0)
MCV: 88.7 fL (ref 80.0–100.0)
Platelets: 202 10*3/uL (ref 150–400)
RBC: 4.25 MIL/uL (ref 3.87–5.11)
RDW: 14.5 % (ref 11.5–15.5)
WBC: 15.8 10*3/uL — ABNORMAL HIGH (ref 4.0–10.5)
nRBC: 0 % (ref 0.0–0.2)

## 2019-12-20 MED ORDER — DIPHENHYDRAMINE HCL 25 MG PO CAPS: 25.0000 mg | ORAL_CAPSULE | Freq: Four times a day (QID) | ORAL | Status: DC | PRN

## 2019-12-20 MED ORDER — DIPHENHYDRAMINE HCL 50 MG/ML IJ SOLN
12.5000 mg | Freq: Once | INTRAMUSCULAR | Status: AC
  Administered 2019-12-20: 12.5 mg via INTRAVENOUS
  Filled 2019-12-20: qty 1

## 2019-12-20 MED ORDER — DILTIAZEM HCL ER COATED BEADS 120 MG PO CP24
120.0000 mg | ORAL_CAPSULE | Freq: Every day | ORAL | Status: DC
  Administered 2019-12-20: 120 mg via ORAL
  Filled 2019-12-20: qty 1

## 2019-12-20 NOTE — Progress Notes (Signed)
Mobility Specialist - Progress Note   12/20/19 1210  Mobility  Activity Ambulated in hall  Level of Assistance Independent  Assistive Device None  Distance Ambulated (ft) 270 ft  Mobility Response Tolerated well  Mobility performed by Mobility specialist  $Mobility charge 1 Mobility    Pre-mobility: 101 HR, 123/71 BP, 95% SpO2 During mobility: 117 HR, 99% SpO2 Post-mobility: 102 HR, 126/84 BP, 99% SpO2   Pt was lying in bed upon arrival. Pt agreed to session. Pt denied any pain or dizziness. Pt was independent in both sit-to-stand and while ambulating. No AD was used, pt pushed IV pole. Pt ambulated a total of 270' with no c/o SOB or fatigue. No heavy breathing was noted. Overall, pt tolerated session very well. Pt was left EOB with phone/call bell in reach. Nurse entered room at end of session.    Filiberto Pinks Mobility Specialist 12/20/19, 12:15 PM

## 2019-12-20 NOTE — Progress Notes (Signed)
Patient ID: Anita SomGloria Best, female   DOB: 1960/10/22, 59 y.o.   MRN: [id number] [facility] PROGRESS NOTE  Anita SomGloria Dwan [id number] DOB: 1960/10/22 DOA: 12/18/2019 PCP: Fannie KneeZipkin, Daniella A, MD  HPI/Subjective: Patient was feeling okay. Appetite starting to come back. Still having some lower abdominal pain. No burning on urination. Was admitted with sepsis. Heart rate has been a little fast. Had fever this morning again.  Objective: Vitals:   12/20/19 1349 12/20/19 1401  BP: 99/83 128/65  Pulse: (!) 115 (!) 116  Resp:  18  Temp: 100.2 F (37.9 C) 98.6 F (37 C)  SpO2: 100% 100%    Intake/Output Summary (Last 24 hours) at 12/20/2019 1614 Last data filed at 12/20/2019 0831 Gross per 24 hour  Intake 2457 ml  Output 400 ml  Net 2057 ml   Filed Weights   12/18/19 1123  Weight: 104.3 kg    ROS: Review of Systems  Respiratory: Negative for shortness of breath.   Cardiovascular: Negative for chest pain.  Gastrointestinal: Positive for abdominal pain. Negative for nausea and vomiting.   Exam: Physical Exam HENT:     Mouth/Throat:     Pharynx: No oropharyngeal exudate.  Eyes:     General: Lids are normal.     Conjunctiva/sclera: Conjunctivae normal.     Pupils: Pupils are equal, round, and reactive to light.  Cardiovascular:     Rate and Rhythm: Regular rhythm. Tachycardia present.     Heart sounds: Normal heart sounds, S1 normal and S2 normal.  Pulmonary:     Breath sounds: No decreased breath sounds, wheezing, rhonchi or rales.  Abdominal:     Palpations: Abdomen is soft.     Tenderness: There is no abdominal tenderness.  Musculoskeletal:     Right ankle: No swelling.     Left ankle: No swelling.  Skin:    General: Skin is warm.     Findings: No rash.  Neurological:     Mental Status: She is alert and oriented to person, place, and time.       Data Reviewed: Basic Metabolic Panel: Recent Labs  Lab 12/18/19 1139 12/19/19 0727 12/20/19 0807  NA 139  137 137  K 3.9 3.5 3.9  CL 100 105 103  CO2 22 21* 21*  GLUCOSE 137* 135* 114*  BUN 13 17 12   CREATININE 1.49* 1.38* 0.99  CALCIUM 9.6 8.6* 8.6*   Liver Function Tests: Recent Labs  Lab 12/18/19 1139  AST 32  ALT 25  ALKPHOS 76  BILITOT 1.2  PROT 8.4*  ALBUMIN 4.0   Recent Labs  Lab 12/18/19 1139  LIPASE 21   CBC: Recent Labs  Lab 12/18/19 1139 12/19/19 0727 12/20/19 0807  WBC 21.3* 21.5* 15.8*  HGB 15.2* 13.2 13.0  HCT 45.9 40.1 37.7  MCV 91.8 91.6 88.7  PLT 223 195 202     Recent Results (from the past 240 hour(s))  Culture, blood (routine x 2)     Status: None (Preliminary result)   Collection Time: 12/18/19 11:39 AM   Specimen: BLOOD  Result Value Ref Range Status   Specimen Description BLOOD BLOOD RIGHT HAND  Final   Special Requests   Final    BOTTLES DRAWN AEROBIC AND ANAEROBIC Blood Culture results may not be optimal due to an inadequate volume of blood received in culture bottles   Culture   Final    NO GROWTH 2 DAYS Performed at [facility], [address] [place], [place] [address]  Report Status PENDING  Incomplete  Culture, blood (routine x 2)     Status: Abnormal (Preliminary result)   Collection Time: 12/18/19 11:39 AM   Specimen: BLOOD  Result Value Ref Range Status   Specimen Description   Final    BLOOD BLOOD LEFT FOREARM Performed at [facility], [address], [place], [place] [address]    Special Requests   Final    BOTTLES DRAWN AEROBIC AND ANAEROBIC Blood Culture results may not be optimal due to an excessive volume of blood received in culture bottles Performed at [facility], [address] [place], [place] [address]    Culture  Setup Time   Final    Organism ID to follow GRAM NEGATIVE RODS IN BOTH AEROBIC AND ANAEROBIC BOTTLES CRITICAL RESULT CALLED TO, READ BACK BY AND VERIFIED WITH: SCOTT HALL 12/18/19 AT 2359 HS Performed at [facility], [address] [place],  [place] [address]    Culture (A)  Final    ESCHERICHIA COLI SUSCEPTIBILITIES TO FOLLOW Performed at [facility], [address] [place], [place] [address]    Report Status PENDING  Incomplete  Urine culture     Status: Abnormal (Preliminary result)   Collection Time: 12/18/19 11:39 AM   Specimen: Urine, Clean Catch  Result Value Ref Range Status   Specimen Description   Final    URINE, CLEAN CATCH Performed at [facility], [address], [place], [place] [address]    Special Requests   Final    NONE Performed at [facility], [address] [place], [place] [address]    Culture >=100,000 COLONIES/mL GRAM NEGATIVE RODS (A)  Final   Report Status PENDING  Incomplete  Blood Culture ID Panel (Reflexed)     Status: Abnormal   Collection Time: 12/18/19 11:39 AM  Result Value Ref Range Status   Enterococcus faecalis NOT DETECTED NOT DETECTED Final   Enterococcus Faecium NOT DETECTED NOT DETECTED Final   Listeria monocytogenes NOT DETECTED NOT DETECTED Final   Staphylococcus species NOT DETECTED NOT DETECTED Final   Staphylococcus aureus (BCID) NOT DETECTED NOT DETECTED Final   Staphylococcus epidermidis NOT DETECTED NOT DETECTED Final   Staphylococcus lugdunensis NOT DETECTED NOT DETECTED Final   Streptococcus species NOT DETECTED NOT DETECTED Final   Streptococcus agalactiae NOT DETECTED NOT DETECTED Final   Streptococcus pneumoniae NOT DETECTED NOT DETECTED Final   Streptococcus pyogenes NOT DETECTED NOT DETECTED Final   A.calcoaceticus-baumannii NOT DETECTED NOT DETECTED Final   Bacteroides fragilis NOT DETECTED NOT DETECTED Final   Enterobacterales DETECTED (A) NOT DETECTED Final    Comment: Enterobacterales represent a large order of gram negative bacteria, not a single organism. CRITICAL RESULT CALLED TO, READ BACK BY AND VERIFIED WITH: SCOTT HALL 12/18/19 AT 2359 HS    Enterobacter cloacae complex NOT DETECTED NOT DETECTED Final   Escherichia coli  DETECTED (A) NOT DETECTED Final    Comment: CRITICAL RESULT CALLED TO, READ BACK BY AND VERIFIED WITH: SCOTT HALL 12/18/19 AT 2359 HS    Klebsiella aerogenes NOT DETECTED NOT DETECTED Final   Klebsiella oxytoca NOT DETECTED NOT DETECTED Final   Klebsiella pneumoniae NOT DETECTED NOT DETECTED Final   Proteus species NOT DETECTED NOT DETECTED Final   Salmonella species NOT DETECTED NOT DETECTED Final   Serratia marcescens NOT DETECTED NOT DETECTED Final   Haemophilus influenzae NOT DETECTED NOT DETECTED Final   Neisseria meningitidis NOT DETECTED NOT DETECTED Final   Pseudomonas aeruginosa NOT DETECTED  NOT DETECTED Final   Stenotrophomonas maltophilia NOT DETECTED NOT DETECTED Final   Candida albicans NOT DETECTED NOT DETECTED Final   Candida auris NOT DETECTED NOT DETECTED Final   Candida glabrata NOT DETECTED NOT DETECTED Final   Candida krusei NOT DETECTED NOT DETECTED Final   Candida parapsilosis NOT DETECTED NOT DETECTED Final   Candida tropicalis NOT DETECTED NOT DETECTED Final   Cryptococcus neoformans/gattii NOT DETECTED NOT DETECTED Final   CTX-M ESBL NOT DETECTED NOT DETECTED Final   Carbapenem resistance IMP NOT DETECTED NOT DETECTED Final   Carbapenem resistance KPC NOT DETECTED NOT DETECTED Final   Carbapenem resistance NDM NOT DETECTED NOT DETECTED Final   Carbapenem resist OXA 48 LIKE NOT DETECTED NOT DETECTED Final   Carbapenem resistance VIM NOT DETECTED NOT DETECTED Final    Comment: Performed at [facility], [address] [place], [place] [address]  SARS Coronavirus 2 by RT PCR (hospital order, performed in [facility] hospital lab) Nasopharyngeal Nasopharyngeal Swab     Status: None   Collection Time: 12/18/19  3:00 PM   Specimen: Nasopharyngeal Swab  Result Value Ref Range Status   SARS Coronavirus 2 NEGATIVE NEGATIVE Final    Comment: (NOTE) SARS-CoV-2 target nucleic acids are NOT DETECTED.  The SARS-CoV-2 RNA is generally detectable in upper and  lower respiratory specimens during the acute phase of infection. The lowest concentration of SARS-CoV-2 viral copies this assay can detect is 250 copies / mL. A negative result does not preclude SARS-CoV-2 infection and should not be used as the sole basis for treatment or other patient management decisions.  A negative result may occur with improper specimen collection / handling, submission of specimen other than nasopharyngeal swab, presence of viral mutation(s) within the areas targeted by this assay, and inadequate number of viral copies (<250 copies / mL). A negative result must be combined with clinical observations, patient history, and epidemiological information.  Fact Sheet for Patients:   BoilerBrush.com.cy  Fact Sheet for Healthcare Providers: https://pope.com/  This test is not yet approved or  cleared by the [place] FDA and has been authorized for detection and/or diagnosis of SARS-CoV-2 by FDA under an Emergency Use Authorization (EUA).  This EUA will remain in effect (meaning this test can be used) for the duration of the COVID-19 declaration under Section 564(b)(1) of the Act, 21 U.S.C. section 360bbb-3(b)(1), unless the authorization is terminated or revoked sooner.  Performed at [facility], [address] [place], [place] [address]       Scheduled Meds: . diltiazem  120 mg Oral QHS  . enoxaparin (LOVENOX) injection  40 mg Subcutaneous Q24H  . phenazopyridine  100 mg Oral TID WC   Continuous Infusions: . sodium chloride 75 mL/hr at 12/20/19 1404  . cefTRIAXone (ROCEPHIN)  IV 2 g (12/20/19 0829)    Assessment/Plan:   1. Severe sepsis present on admission with acute kidney injury. E. coli growing in the blood cultures. Still waiting for the sensitivities. On IV Rocephin. Patient still spiking fevers. Still with tachycardia. Continue IV fluid hydration. 2. Acute kidney injury. Creatinine  improved from 1.49 down to 0.99. 3. Patient had some itching in her hands gave Benadryl. 4. Essential hypertension switch Norvasc over to Cardizem CD to control heart rate a little bit better      Code Status:     Code Status Orders  (From admission, onward)         Start     Ordered   12/18/19  1436  Full code  Continuous        12/18/19 1437        Code Status History    This patient has a current code status but no historical code status.   Advance Care Planning Activity     Family Communication: Daughter at bedside Disposition Plan: Status is: Inpatient  Dispo: The patient is from: Home              Anticipated d/c is to: Home              Anticipated d/c date is: Potentially 12/21/2019 versus 12/22/2019 based on whether I have cultures back and whether if she is still febrile or not.              Patient currently being treated for severe sepsis with E. coli on IV antibiotics. Still spiking fever.  Antibiotics:  Rocephin  Time spent: 27 minutes.  Serenitee Fuertes [employer]  [facility] [employer]

## 2019-12-21 DIAGNOSIS — R000 Tachycardia, unspecified: Secondary | ICD-10-CM

## 2019-12-21 LAB — URINE CULTURE: Culture: >=100000 — AB

## 2019-12-21 LAB — CULTURE, BLOOD (ROUTINE X 2)

## 2019-12-21 MED ORDER — AMLODIPINE BESYLATE 5 MG PO TABS
5.0000 mg | ORAL_TABLET | Freq: Every day | ORAL | Status: DC
  Administered 2019-12-21 – 2019-12-22 (×2): 5 mg via ORAL
  Filled 2019-12-21 (×2): qty 1

## 2019-12-21 NOTE — Progress Notes (Signed)
Patient ID: Anita SomGloria Best, female   DOB: [birth year], 59 y.o.   MRN: [id number] [facility] PROGRESS NOTE  Anita Best [id number] DOB: [birth year] DOA: 12/18/2019 PCP: Fannie KneeZipkin, Daniella A, MD  HPI/Subjective: Patient had a fever this morning had chills and sweating and did not feel good.  Felt a little bit better when I saw her.  On IV Rocephin for E. coli sepsis.  Objective: Vitals:   12/21/19 0638 12/21/19 1129  BP: 123/75 (!) 146/80  Pulse: 97 (!) 51  Resp: 18   Temp: 98.3 F (36.8 C) 99.7 F (37.6 C)  SpO2: 95% 95%    Intake/Output Summary (Last 24 hours) at 12/21/2019 1527 Last data filed at 12/21/2019 1516 Gross per 24 hour  Intake 480 ml  Output 750 ml  Net -270 ml   Filed Weights   12/18/19 1123  Weight: 104.3 kg    ROS: Review of Systems  Respiratory: Negative for cough and shortness of breath.   Cardiovascular: Negative for chest pain.  Gastrointestinal: Negative for abdominal pain, nausea and vomiting.   Exam: Physical Exam HENT:     Nose: No mucosal edema.     Mouth/Throat:     Pharynx: No oropharyngeal exudate.  Eyes:     General: Lids are normal.     Conjunctiva/sclera: Conjunctivae normal.     Pupils: Pupils are equal, round, and reactive to light.  Cardiovascular:     Rate and Rhythm: Normal rate and regular rhythm.     Heart sounds: Normal heart sounds, S1 normal and S2 normal.  Pulmonary:     Breath sounds: No decreased breath sounds, wheezing, rhonchi or rales.  Abdominal:     Palpations: Abdomen is soft.     Tenderness: There is no abdominal tenderness.  Musculoskeletal:     Right ankle: No swelling.     Left ankle: No swelling.  Skin:    General: Skin is warm.     Findings: No rash.  Neurological:     Mental Status: She is alert and oriented to person, place, and time.       Data Reviewed: Basic Metabolic Panel: Recent Labs  Lab 12/18/19 1139 12/19/19 0727 12/20/19 0807  NA 139 137 137  K 3.9 3.5 3.9  CL 100 105  103  CO2 22 21* 21*  GLUCOSE 137* 135* 114*  BUN 13 17 12   CREATININE 1.49* 1.38* 0.99  CALCIUM 9.6 8.6* 8.6*   Liver Function Tests: Recent Labs  Lab 12/18/19 1139  AST 32  ALT 25  ALKPHOS 76  BILITOT 1.2  PROT 8.4*  ALBUMIN 4.0   Recent Labs  Lab 12/18/19 1139  LIPASE 21   CBC: Recent Labs  Lab 12/18/19 1139 12/19/19 0727 12/20/19 0807  WBC 21.3* 21.5* 15.8*  HGB 15.2* 13.2 13.0  HCT 45.9 40.1 37.7  MCV 91.8 91.6 88.7  PLT 223 195 202     Recent Results (from the past 240 hour(s))  Culture, blood (routine x 2)     Status: None (Preliminary result)   Collection Time: 12/18/19 11:39 AM   Specimen: BLOOD  Result Value Ref Range Status   Specimen Description BLOOD BLOOD RIGHT HAND  Final   Special Requests   Final    BOTTLES DRAWN AEROBIC AND ANAEROBIC Blood Culture results may not be optimal due to an inadequate volume of blood received in culture bottles   Culture   Final    NO GROWTH 3 DAYS Performed at [facility], [address] [place], [place] [address]    Report Status PENDING  Incomplete  Culture, blood (routine x 2)     Status: Abnormal   Collection Time: 12/18/19 11:39 AM   Specimen: BLOOD  Result Value Ref Range Status   Specimen Description   Final    BLOOD BLOOD LEFT FOREARM Performed at [facility], [address] [place], [place] [address]    Special Requests   Final    BOTTLES DRAWN AEROBIC AND ANAEROBIC Blood Culture results may not be optimal due to an excessive volume of blood received in culture bottles Performed at [facility], [address] [place], [place] [address]    Culture  Setup Time   Final    Organism ID to follow GRAM NEGATIVE RODS IN BOTH AEROBIC AND ANAEROBIC BOTTLES CRITICAL RESULT CALLED TO, READ BACK BY AND VERIFIED WITH: SCOTT HALL 12/18/19 AT 2359 HS Performed at [facility], [address] [place], [place] [address]    Culture ESCHERICHIA COLI (A)  Final   Report  Status 12/21/2019 FINAL  Final   Organism ID, Bacteria ESCHERICHIA COLI  Final      Susceptibility   Escherichia coli - MIC*    AMPICILLIN <=2 SENSITIVE Sensitive     CEFAZOLIN <=4 SENSITIVE Sensitive     CEFEPIME <=0.12 SENSITIVE Sensitive     CEFTAZIDIME <=1 SENSITIVE Sensitive     CEFTRIAXONE <=0.25 SENSITIVE Sensitive     CIPROFLOXACIN <=0.25 SENSITIVE Sensitive     GENTAMICIN <=1 SENSITIVE Sensitive     IMIPENEM <=0.25 SENSITIVE Sensitive     TRIMETH/SULFA >=320 RESISTANT Resistant     AMPICILLIN/SULBACTAM <=2 SENSITIVE Sensitive     PIP/TAZO <=4 SENSITIVE Sensitive     * ESCHERICHIA COLI  Urine culture     Status: Abnormal   Collection Time: 12/18/19 11:39 AM   Specimen: Urine, Clean Catch  Result Value Ref Range Status   Specimen Description   Final    URINE, CLEAN CATCH Performed at [facility], [address], [place], [place] [address]    Special Requests   Final    NONE Performed at [facility], [address] [place], [place] [address]    Culture >=100,000 COLONIES/mL ESCHERICHIA COLI (A)  Final   Report Status 12/21/2019 FINAL  Final   Organism ID, Bacteria ESCHERICHIA COLI (A)  Final      Susceptibility   Escherichia coli - MIC*    AMPICILLIN <=2 SENSITIVE Sensitive     CEFAZOLIN <=4 SENSITIVE Sensitive     CEFTRIAXONE <=0.25 SENSITIVE Sensitive     CIPROFLOXACIN <=0.25 SENSITIVE Sensitive     GENTAMICIN <=1 SENSITIVE Sensitive     IMIPENEM <=0.25 SENSITIVE Sensitive     NITROFURANTOIN <=16 SENSITIVE Sensitive     TRIMETH/SULFA >=320 RESISTANT Resistant     AMPICILLIN/SULBACTAM <=2 SENSITIVE Sensitive     PIP/TAZO <=4 SENSITIVE Sensitive     * >=100,000 COLONIES/mL ESCHERICHIA COLI  Blood Culture ID Panel (Reflexed)     Status: Abnormal   Collection Time: 12/18/19 11:39 AM  Result Value Ref Range Status   Enterococcus faecalis NOT DETECTED NOT DETECTED Final   Enterococcus Faecium NOT DETECTED NOT DETECTED Final   Listeria  monocytogenes NOT DETECTED NOT DETECTED Final   Staphylococcus species NOT DETECTED NOT DETECTED Final   Staphylococcus aureus (BCID) NOT DETECTED NOT DETECTED Final   Staphylococcus epidermidis NOT DETECTED NOT DETECTED Final   Staphylococcus lugdunensis NOT DETECTED NOT DETECTED Final   Streptococcus species  NOT DETECTED NOT DETECTED Final   Streptococcus agalactiae NOT DETECTED NOT DETECTED Final   Streptococcus pneumoniae NOT DETECTED NOT DETECTED Final   Streptococcus pyogenes NOT DETECTED NOT DETECTED Final   A.calcoaceticus-baumannii NOT DETECTED NOT DETECTED Final   Bacteroides fragilis NOT DETECTED NOT DETECTED Final   Enterobacterales DETECTED (A) NOT DETECTED Final    Comment: Enterobacterales represent a large order of gram negative bacteria, not a single organism. CRITICAL RESULT CALLED TO, READ BACK BY AND VERIFIED WITH: SCOTT HALL 12/18/19 AT 2359 HS    Enterobacter cloacae complex NOT DETECTED NOT DETECTED Final   Escherichia coli DETECTED (A) NOT DETECTED Final    Comment: CRITICAL RESULT CALLED TO, READ BACK BY AND VERIFIED WITH: SCOTT HALL 12/18/19 AT 2359 HS    Klebsiella aerogenes NOT DETECTED NOT DETECTED Final   Klebsiella oxytoca NOT DETECTED NOT DETECTED Final   Klebsiella pneumoniae NOT DETECTED NOT DETECTED Final   Proteus species NOT DETECTED NOT DETECTED Final   Salmonella species NOT DETECTED NOT DETECTED Final   Serratia marcescens NOT DETECTED NOT DETECTED Final   Haemophilus influenzae NOT DETECTED NOT DETECTED Final   Neisseria meningitidis NOT DETECTED NOT DETECTED Final   Pseudomonas aeruginosa NOT DETECTED NOT DETECTED Final   Stenotrophomonas maltophilia NOT DETECTED NOT DETECTED Final   Candida albicans NOT DETECTED NOT DETECTED Final   Candida auris NOT DETECTED NOT DETECTED Final   Candida glabrata NOT DETECTED NOT DETECTED Final   Candida krusei NOT DETECTED NOT DETECTED Final   Candida parapsilosis NOT DETECTED NOT DETECTED Final   Candida  tropicalis NOT DETECTED NOT DETECTED Final   Cryptococcus neoformans/gattii NOT DETECTED NOT DETECTED Final   CTX-M ESBL NOT DETECTED NOT DETECTED Final   Carbapenem resistance IMP NOT DETECTED NOT DETECTED Final   Carbapenem resistance KPC NOT DETECTED NOT DETECTED Final   Carbapenem resistance NDM NOT DETECTED NOT DETECTED Final   Carbapenem resist OXA 48 LIKE NOT DETECTED NOT DETECTED Final   Carbapenem resistance VIM NOT DETECTED NOT DETECTED Final    Comment: Performed at [facility], [address] [place], [place] [address]  SARS Coronavirus 2 by RT PCR (hospital order, performed in [facility] hospital lab) Nasopharyngeal Nasopharyngeal Swab     Status: None   Collection Time: 12/18/19  3:00 PM   Specimen: Nasopharyngeal Swab  Result Value Ref Range Status   SARS Coronavirus 2 NEGATIVE NEGATIVE Final    Comment: (NOTE) SARS-CoV-2 target nucleic acids are NOT DETECTED.  The SARS-CoV-2 RNA is generally detectable in upper and lower respiratory specimens during the acute phase of infection. The lowest concentration of SARS-CoV-2 viral copies this assay can detect is 250 copies / mL. A negative result does not preclude SARS-CoV-2 infection and should not be used as the sole basis for treatment or other patient management decisions.  A negative result may occur with improper specimen collection / handling, submission of specimen other than nasopharyngeal swab, presence of viral mutation(s) within the areas targeted by this assay, and inadequate number of viral copies (<250 copies / mL). A negative result must be combined with clinical observations, patient history, and epidemiological information.  Fact Sheet for Patients:   BoilerBrush.com.cy  Fact Sheet for Healthcare Providers: https://pope.com/  This test is not yet approved or  cleared by the [place] FDA and has been authorized for detection and/or diagnosis  of SARS-CoV-2 by FDA under an Emergency Use Authorization (EUA).  This EUA will remain in effect (meaning this test can be used)  for the duration of the COVID-19 declaration under Section 564(b)(1) of the Act, 21 U.S.C. section 360bbb-3(b)(1), unless the authorization is terminated or revoked sooner.  Performed at [facility], [address] [place], [place] [address]       Scheduled Meds: . diltiazem  120 mg Oral QHS  . enoxaparin (LOVENOX) injection  40 mg Subcutaneous Q24H  . phenazopyridine  100 mg Oral TID WC   Continuous Infusions: . sodium chloride 75 mL/hr at 12/21/19 0526  . cefTRIAXone (ROCEPHIN)  IV 2 g (12/21/19 0831)    Assessment/Plan:  1. Severe sepsis present on admission with acute kidney injury.  E. coli growing out of blood cultures.  Had a fever of 102 this morning.  Would like to see the temperature curve trend better prior to disposition.  Hopefully tomorrow. 2. Acute kidney injury.  Creatinine improved from 1.49 to down to 0.99.  Holding lisinopril. 3. Itching of her hands yesterday improved with Benadryl 4. Essential hypertension and tachycardia.  We will switch her blood pressure medication back to her original Norvasc.      Code Status:     Code Status Orders  (From admission, onward)         Start     Ordered   12/18/19 1436  Full code  Continuous        12/18/19 1437        Code Status History    This patient has a current code status but no historical code status.   Advance Care Planning Activity     Disposition Plan: Status is: Inpatient  Dispo: The patient is from: Home              Anticipated d/c is to: Home              Anticipated d/c date is: Potentially 12/22/2019 if not having high fevers.              Patient currently being treated for severe sepsis with IV antibiotics.  Antibiotics:  IV Rocephin  Time spent: 26 minutes  [employer]

## 2019-12-21 NOTE — Progress Notes (Signed)
Mobility Specialist - Progress Note   12/21/19 1204  Mobility  Activity Refused mobility  Mobility performed by Mobility specialist     Pt refused mobility session this AM d/t not "feeling well this morning" and feeling "foggy". Pt stated she "didn't have a good night last night", and "didn't eat this morning". Will attempt at a later date/time.    Wilber Fini Mobility Specialist  12/21/19, 12:06 PM

## 2019-12-22 LAB — CBC
HCT: 37.1 % (ref 36.0–46.0)
Hemoglobin: 12.7 g/dL (ref 12.0–15.0)
MCH: 29.7 pg (ref 26.0–34.0)
MCHC: 34.2 g/dL (ref 30.0–36.0)
MCV: 86.7 fL (ref 80.0–100.0)
Platelets: 263 10*3/uL (ref 150–400)
RBC: 4.28 MIL/uL (ref 3.87–5.11)
RDW: 14.3 % (ref 11.5–15.5)
WBC: 13.3 10*3/uL — ABNORMAL HIGH (ref 4.0–10.5)
nRBC: 0 % (ref 0.0–0.2)

## 2019-12-22 LAB — BASIC METABOLIC PANEL
Anion gap: 10 (ref 5–15)
BUN: 8 mg/dL (ref 6–20)
CO2: 25 mmol/L (ref 22–32)
Calcium: 8.7 mg/dL — ABNORMAL LOW (ref 8.9–10.3)
Chloride: 103 mmol/L (ref 98–111)
Creatinine, Ser: 0.78 mg/dL (ref 0.44–1.00)
GFR calc Af Amer: >60 mL/min (ref >60)
GFR calc non Af Amer: >60 mL/min (ref >60)
Glucose, Bld: 111 mg/dL — ABNORMAL HIGH (ref 70–99)
Potassium: 3.5 mmol/L (ref 3.5–5.1)
Sodium: 138 mmol/L (ref 135–145)

## 2019-12-22 NOTE — Progress Notes (Addendum)
No fevers have been reported at this time. No falls. No pain reported. IV antibiotics provided. IV antibiotics restarted as they had to be stopped due to IV infiltration.

## 2019-12-22 NOTE — Progress Notes (Signed)
Mobility Specialist - Progress Note   12/22/19 1248  Mobility  Activity Ambulated in room;Ambulated in hall  Level of Assistance Independent  Assistive Device None  Distance Ambulated (ft) 550 ft  Mobility Response Tolerated well  Mobility performed by Mobility specialist  $Mobility charge 1 Mobility    During mobility: 103 HR, 95% SpO2   Pt standing bedside upon arrival. Pt agreed to mobility session. Pt states she "is ready to go home" and feels "fine". Pt ambulated 550' total in room and hallway independently. Pt had no complaints during session. She tolerated session very well. Pt left sitting EOB eating lunch.    Havah Ammon Mobility Specialist  12/22/19, 12:56 PM

## 2019-12-22 NOTE — Progress Notes (Signed)
PROGRESS NOTE    Anita Best  GQB:[id number] DOB: Nov 11, 1960 DOA: 12/18/2019 PCP: Fannie Knee, MD    Brief Narrative:  Anita Best is a 59 y.o. female with medical history significant for hypertension and borderline diabetes mellitus who presents to the ER for evaluation of a 3-day history of dysuria, difficulty voiding, weakness, fever and nausea.  Patient stated that she did started having urinary frequency about 3 days prior to her presentation over the last 24 hours developed severe dysuria with inability to void.  She also complains of fever and chills but did not check a temp at home.  She states that she is unable to void small amounts due to the discomfort. She also complains of suprapubic pain which she rates a 5 x 10 in intensity at its worst.  Pain is nonradiating.  CT renal shows the bladder is thick walled with adjacent stranding suggesting significant cystitis.  There is stranding in the left upper quadrant, thought to arise from the left kidney. There is no evidence of obstruction. This could represent sequela of underlying infection/pyelonephritis. The finding could also be seen with a recently passed stone although there is no hydronephrosis on today's study. She is admitted for severe sepsis secondary to UTI.  Blood cultures growing E. coli ESBL she is started on IV antibiotics.  Assessment & Plan:   Principal Problem:   Severe sepsis (HCC) Active Problems:   Acute lower UTI   Essential hypertension   Sepsis due to Escherichia coli with acute renal failure without septic shock (HCC)   Itching   Tachycardia  1. Severe sepsis present on admission with acute kidney injury.  E. coli growing out of blood cultures.  Had a fever of 102  today.  Would like to see the temperature curve trend better prior to disposition.   2. Acute kidney injury. Improved.   Creatinine improved from 1.49 to down to 0.99.  Holding lisinopril. 3. Itching of her hands yesterday improved  with Benadryl 4. Essential hypertension and tachycardia.  We will switch her blood pressure medication back to her original Norvasc.   DVT prophylaxis:  Lovenox Code Status: Full Family Communication: Discussed with patient in detail. Disposition Plan:  Anticipated DC home in 1-2 days.   Consultants:   None.  Procedures:  Antimicrobials  Anti-infectives (From admission, onward)   Start     Dose/Rate Route Frequency Ordered Stop   12/19/19 1000  cefTRIAXone (ROCEPHIN) 1 g in sodium chloride 0.9 % 100 mL IVPB  Status:  Discontinued        1 g 200 mL/hr over 30 Minutes Intravenous Every 24 hours 12/18/19 1437 12/18/19 1454   12/19/19 0800  cefTRIAXone (ROCEPHIN) 2 g in sodium chloride 0.9 % 100 mL IVPB  Status:  Discontinued        2 g 200 mL/hr over 30 Minutes Intravenous Every 24 hours 12/19/19 0209 12/19/19 0733   12/19/19 0800  cefTRIAXone (ROCEPHIN) 2 g in sodium chloride 0.9 % 100 mL IVPB        2 g 200 mL/hr over 30 Minutes Intravenous Every 24 hours 12/19/19 0754     12/18/19 1300  cefTRIAXone (ROCEPHIN) 1 g in sodium chloride 0.9 % 100 mL IVPB  Status:  Discontinued        1 g 200 mL/hr over 30 Minutes Intravenous Every 24 hours 12/18/19 1250 12/19/19 0209     Subjective: Patient was seen and examined at bedside.  Overnight events noted.  She had a  fever of 100.7 in the morning.  she denies any other concerns.  Objective: Vitals:   12/21/19 1129 12/21/19 1959 12/21/19 2110 12/22/19 0718  BP: (!) 146/80 130/72  126/77  Pulse: (!) 51 (!) 103  (!) 102  Resp:  18  18  Temp: 99.7 F (37.6 C) (!) 100.7 F (38.2 C) 98.5 F (36.9 C) 99.6 F (37.6 C)  TempSrc: Oral Oral Oral Oral  SpO2: 95% 100%  99%  Weight:      Height:        Intake/Output Summary (Last 24 hours) at 12/22/2019 1345 Last data filed at 12/21/2019 1516 Gross per 24 hour  Intake --  Output 750 ml  Net -750 ml   Filed Weights   12/18/19 1123  Weight: 104.3 kg    Examination:  General exam:  Appears calm and comfortable  Respiratory system: Clear to auscultation. Respiratory effort normal. Cardiovascular system: S1 & S2 heard, RRR. No JVD, murmurs, rubs, gallops or clicks. No pedal edema. Gastrointestinal system: Abdomen is nondistended, soft and nontender. No organomegaly or masses felt. Normal bowel sounds heard. Central nervous system: Alert and oriented. No focal neurological deficits. Extremities: Symmetric 5 x 5 power. Skin: No rashes, lesions or ulcers Psychiatry: Judgement and insight appear normal. Mood & affect appropriate.     Data Reviewed: I have personally reviewed following labs and imaging studies  CBC: Recent Labs  Lab 12/18/19 1139 12/19/19 0727 12/20/19 0807 12/22/19 0602  WBC 21.3* 21.5* 15.8* 13.3*  HGB 15.2* 13.2 13.0 12.7  HCT 45.9 40.1 37.7 37.1  MCV 91.8 91.6 88.7 86.7  PLT 223 195 202 263   Basic Metabolic Panel: Recent Labs  Lab 12/18/19 1139 12/19/19 0727 12/20/19 0807 12/22/19 0602  NA 139 137 137 138  K 3.9 3.5 3.9 3.5  CL 100 105 103 103  CO2 22 21* 21* 25  GLUCOSE 137* 135* 114* 111*  BUN 13 17 12 8   CREATININE 1.49* 1.38* 0.99 0.78  CALCIUM 9.6 8.6* 8.6* 8.7*   GFR: Estimated Creatinine Clearance: 94.7 mL/min (by C-G formula based on SCr of 0.78 mg/dL). Liver Function Tests: Recent Labs  Lab 12/18/19 1139  AST 32  ALT 25  ALKPHOS 76  BILITOT 1.2  PROT 8.4*  ALBUMIN 4.0   Recent Labs  Lab 12/18/19 1139  LIPASE 21   No results for input(s): AMMONIA in the last 168 hours. Coagulation Profile: Recent Labs  Lab 12/19/19 0727  INR 1.2   Cardiac Enzymes: No results for input(s): CKTOTAL, CKMB, CKMBINDEX, TROPONINI in the last 168 hours. BNP (last 3 results) No results for input(s): PROBNP in the last 8760 hours. HbA1C: No results for input(s): HGBA1C in the last 72 hours. CBG: No results for input(s): GLUCAP in the last 168 hours. Lipid Profile: No results for input(s): CHOL, HDL, LDLCALC, TRIG,  CHOLHDL, LDLDIRECT in the last 72 hours. Thyroid Function Tests: No results for input(s): TSH, T4TOTAL, FREET4, T3FREE, THYROIDAB in the last 72 hours. Anemia Panel: No results for input(s): VITAMINB12, FOLATE, FERRITIN, TIBC, IRON, RETICCTPCT in the last 72 hours. Sepsis Labs: Recent Labs  Lab 12/18/19 1139 12/18/19 1500 12/19/19 0727  PROCALCITON  --   --  4.30  LATICACIDVEN 2.0* 1.0  --     Recent Results (from the past 240 hour(s))  Culture, blood (routine x 2)     Status: None (Preliminary result)   Collection Time: 12/18/19 11:39 AM   Specimen: BLOOD  Result Value Ref Range Status   Specimen  Description BLOOD BLOOD RIGHT HAND  Final   Special Requests   Final    BOTTLES DRAWN AEROBIC AND ANAEROBIC Blood Culture results may not be optimal due to an inadequate volume of blood received in culture bottles   Culture   Final    NO GROWTH 4 DAYS Performed at [facility], [address] [place], [place] [address]    Report Status PENDING  Incomplete  Culture, blood (routine x 2)     Status: Abnormal   Collection Time: 12/18/19 11:39 AM   Specimen: BLOOD  Result Value Ref Range Status   Specimen Description   Final    BLOOD BLOOD LEFT FOREARM Performed at [facility], [address], [place], [place] [address]    Special Requests   Final    BOTTLES DRAWN AEROBIC AND ANAEROBIC Blood Culture results may not be optimal due to an excessive volume of blood received in culture bottles Performed at [facility], [address] [place], [place] [address]    Culture  Setup Time   Final    Organism ID to follow GRAM NEGATIVE RODS IN BOTH AEROBIC AND ANAEROBIC BOTTLES CRITICAL RESULT CALLED TO, READ BACK BY AND VERIFIED WITH: SCOTT HALL 12/18/19 AT 2359 HS Performed at [facility], [address] [place], [place] [address]    Culture ESCHERICHIA COLI (A)  Final   Report Status 12/21/2019 FINAL  Final   Organism ID, Bacteria ESCHERICHIA  COLI  Final      Susceptibility   Escherichia coli - MIC*    AMPICILLIN <=2 SENSITIVE Sensitive     CEFAZOLIN <=4 SENSITIVE Sensitive     CEFEPIME <=0.12 SENSITIVE Sensitive     CEFTAZIDIME <=1 SENSITIVE Sensitive     CEFTRIAXONE <=0.25 SENSITIVE Sensitive     CIPROFLOXACIN <=0.25 SENSITIVE Sensitive     GENTAMICIN <=1 SENSITIVE Sensitive     IMIPENEM <=0.25 SENSITIVE Sensitive     TRIMETH/SULFA >=320 RESISTANT Resistant     AMPICILLIN/SULBACTAM <=2 SENSITIVE Sensitive     PIP/TAZO <=4 SENSITIVE Sensitive     * ESCHERICHIA COLI  Urine culture     Status: Abnormal   Collection Time: 12/18/19 11:39 AM   Specimen: Urine, Clean Catch  Result Value Ref Range Status   Specimen Description   Final    URINE, CLEAN CATCH Performed at [facility], [address] [place], [place] [address]    Special Requests   Final    NONE Performed at [facility], [address] [place], [place] [address]    Culture >=100,000 COLONIES/mL ESCHERICHIA COLI (A)  Final   Report Status 12/21/2019 FINAL  Final   Organism ID, Bacteria ESCHERICHIA COLI (A)  Final      Susceptibility   Escherichia coli - MIC*    AMPICILLIN <=2 SENSITIVE Sensitive     CEFAZOLIN <=4 SENSITIVE Sensitive     CEFTRIAXONE <=0.25 SENSITIVE Sensitive     CIPROFLOXACIN <=0.25 SENSITIVE Sensitive     GENTAMICIN <=1 SENSITIVE Sensitive     IMIPENEM <=0.25 SENSITIVE Sensitive     NITROFURANTOIN <=16 SENSITIVE Sensitive     TRIMETH/SULFA >=320 RESISTANT Resistant     AMPICILLIN/SULBACTAM <=2 SENSITIVE Sensitive     PIP/TAZO <=4 SENSITIVE Sensitive     * >=100,000 COLONIES/mL ESCHERICHIA COLI  Blood Culture ID Panel (Reflexed)     Status: Abnormal   Collection Time: 12/18/19 11:39 AM  Result Value Ref Range Status   Enterococcus faecalis NOT DETECTED NOT DETECTED  Final   Enterococcus Faecium NOT DETECTED NOT DETECTED Final   Listeria monocytogenes NOT DETECTED NOT DETECTED Final   Staphylococcus species NOT  DETECTED NOT DETECTED Final   Staphylococcus aureus (BCID) NOT DETECTED NOT DETECTED Final   Staphylococcus epidermidis NOT DETECTED NOT DETECTED Final   Staphylococcus lugdunensis NOT DETECTED NOT DETECTED Final   Streptococcus species NOT DETECTED NOT DETECTED Final   Streptococcus agalactiae NOT DETECTED NOT DETECTED Final   Streptococcus pneumoniae NOT DETECTED NOT DETECTED Final   Streptococcus pyogenes NOT DETECTED NOT DETECTED Final   A.calcoaceticus-baumannii NOT DETECTED NOT DETECTED Final   Bacteroides fragilis NOT DETECTED NOT DETECTED Final   Enterobacterales DETECTED (A) NOT DETECTED Final    Comment: Enterobacterales represent a large order of gram negative bacteria, not a single organism. CRITICAL RESULT CALLED TO, READ BACK BY AND VERIFIED WITH: SCOTT HALL 12/18/19 AT 2359 HS    Enterobacter cloacae complex NOT DETECTED NOT DETECTED Final   Escherichia coli DETECTED (A) NOT DETECTED Final    Comment: CRITICAL RESULT CALLED TO, READ BACK BY AND VERIFIED WITH: SCOTT HALL 12/18/19 AT 2359 HS    Klebsiella aerogenes NOT DETECTED NOT DETECTED Final   Klebsiella oxytoca NOT DETECTED NOT DETECTED Final   Klebsiella pneumoniae NOT DETECTED NOT DETECTED Final   Proteus species NOT DETECTED NOT DETECTED Final   Salmonella species NOT DETECTED NOT DETECTED Final   Serratia marcescens NOT DETECTED NOT DETECTED Final   Haemophilus influenzae NOT DETECTED NOT DETECTED Final   Neisseria meningitidis NOT DETECTED NOT DETECTED Final   Pseudomonas aeruginosa NOT DETECTED NOT DETECTED Final   Stenotrophomonas maltophilia NOT DETECTED NOT DETECTED Final   Candida albicans NOT DETECTED NOT DETECTED Final   Candida auris NOT DETECTED NOT DETECTED Final   Candida glabrata NOT DETECTED NOT DETECTED Final   Candida krusei NOT DETECTED NOT DETECTED Final   Candida parapsilosis NOT DETECTED NOT DETECTED Final   Candida tropicalis NOT DETECTED NOT DETECTED Final   Cryptococcus neoformans/gattii  NOT DETECTED NOT DETECTED Final   CTX-M ESBL NOT DETECTED NOT DETECTED Final   Carbapenem resistance IMP NOT DETECTED NOT DETECTED Final   Carbapenem resistance KPC NOT DETECTED NOT DETECTED Final   Carbapenem resistance NDM NOT DETECTED NOT DETECTED Final   Carbapenem resist OXA 48 LIKE NOT DETECTED NOT DETECTED Final   Carbapenem resistance VIM NOT DETECTED NOT DETECTED Final    Comment: Performed at [facility], [address] [place], [place] [address]  SARS Coronavirus 2 by RT PCR (hospital order, performed in [facility] hospital lab) Nasopharyngeal Nasopharyngeal Swab     Status: None   Collection Time: 12/18/19  3:00 PM   Specimen: Nasopharyngeal Swab  Result Value Ref Range Status   SARS Coronavirus 2 NEGATIVE NEGATIVE Final    Comment: (NOTE) SARS-CoV-2 target nucleic acids are NOT DETECTED.  The SARS-CoV-2 RNA is generally detectable in upper and lower respiratory specimens during the acute phase of infection. The lowest concentration of SARS-CoV-2 viral copies this assay can detect is 250 copies / mL. A negative result does not preclude SARS-CoV-2 infection and should not be used as the sole basis for treatment or other patient management decisions.  A negative result may occur with improper specimen collection / handling, submission of specimen other than nasopharyngeal swab, presence of viral mutation(s) within the areas targeted by this assay, and inadequate number of viral copies (<250 copies / mL). A negative result must be combined with clinical observations, patient history, and epidemiological information.  Fact Sheet for Patients:   BoilerBrush.com.cyhttps://www.fda.gov/media/136312/download  Fact Sheet for Healthcare Providers: https://pope.com/https://www.fda.gov/media/136313/download  This test is not yet approved or  cleared by the [place] FDA and has been authorized for detection and/or diagnosis of SARS-CoV-2 by FDA under an Emergency Use Authorization (EUA).  This EUA  will remain in effect (meaning this test can be used) for the duration of the COVID-19 declaration under Section 564(b)(1) of the Act, 21 U.S.C. section 360bbb-3(b)(1), unless the authorization is terminated or revoked sooner.  Performed at [facility], [address] [place], [place] [address]      Radiology Studies: No results found.  Scheduled Meds: . amLODipine  5 mg Oral QHS  . enoxaparin (LOVENOX) injection  40 mg Subcutaneous Q24H  . phenazopyridine  100 mg Oral TID WC   Continuous Infusions: . sodium chloride 75 mL/hr at 12/22/19 0719  . cefTRIAXone (ROCEPHIN)  IV 2 g (12/22/19 0817)     LOS: 4 days    Time spent:  25 mins.    Cipriano BunkerPARDEEP Edris Friedt, MD [facility]   If 7PM-7AM, please contact night-coverage

## 2019-12-23 LAB — CULTURE, BLOOD (ROUTINE X 2): Culture: NO GROWTH

## 2019-12-23 MED ORDER — LEVOFLOXACIN 750 MG PO TABS: 750.0000 mg | ORAL_TABLET | Freq: Every day | ORAL | 0 refills | Status: AC

## 2019-12-23 MED ORDER — LEVOFLOXACIN 750 MG PO TABS: 750.0000 mg | ORAL_TABLET | Freq: Every day | ORAL | 0 refills | Status: DC

## 2019-12-23 NOTE — Progress Notes (Signed)
Discharge instructions reviewed with patient including followup visits and new medications.  Understanding was verbalized and all questions were answered.  IV removed without complication; patient tolerated well.  Patient discharged home ambulatory in stable condition. 

## 2019-12-23 NOTE — Discharge Summary (Signed)
Physician Discharge Summary  Anita Best LKG:[id number] DOB: 1960-12-04 DOA: 12/18/2019  PCP: Fannie Knee, MD  Admit date: 12/18/2019  Discharge date: 12/23/2019  Admitted From:  Home.  Disposition:  Home   Recommendations for Outpatient Follow-up:  Follow up with PCP in 1-2 weeks. Please obtain BMP/CBC in one week. Advised to take Levaquin 750 mg daily for 5 days staring 12/24/2019.  Home Health:  None. Equipment/Devices: None. Discharge Condition: Stable CODE STATUS:Full code Diet recommendation: Heart Healthy  Dysphagia   Brief Summary / Hospital course: Anita Best is a 59 y.o. female with medical history significant for hypertension and borderline diabetes mellitus who presents to the ER for evaluation of a 3-day history of dysuria, difficulty voiding, weakness, fever and nausea.  Patient stated that she started having urinary frequency about 3 days prior to her presentation over the last 24 hours developed severe dysuria with inability to void.  She also complains of fever and chills but did not check a temp at home.  She states that she is unable to void small amounts due to the discomfort. She also complains of suprapubic pain which she rates a 5 x 10 in intensity at its worst.  Pain is nonradiating.  CT renal shows the bladder is thick walled with adjacent stranding suggesting significant cystitis. There is stranding in the left upper quadrant, thought to arise from the left kidney. There is no evidence of obstruction. This could represent sequela of underlying infection/pyelonephritis. The finding could also be seen with a recently passed stone although there is no hydronephrosis on today's study.  She was admitted for severe sepsis secondary to UTI.  Blood cultures growing Enterobacter , urine culture positive for E coli pan senitive.  she is started on IV antibiotics. She remained on ceftriaxone and she has taken ceftriaxone for 5 days.  She continues to have a fever, one  of the blood culture shows Enterobacter,  urine culture grew E. coli which was pansensitive.  Discussed with ID specialist , recommended patient can be discharged on Levaquin for 5 more days.  She remained afebrile for 24 hours before discharge.  Patient feels better and wants to be discharged.  Patient is being discharged home on Levaquin 750 mg for 5 more days.  Advised to follow-up with primary care physician in one week.  She was managed for below problems.  Discharge Diagnoses:  Principal Problem:   Severe sepsis (HCC) Active Problems:   Acute lower UTI   Essential hypertension   Sepsis due to Escherichia coli with acute renal failure without septic shock (HCC)   Itching   Tachycardia  Severe sepsis present on admission with acute kidney injury. >>> E. coli growing out of blood cultures.  Had a fever of 102  8/24.    Would like to see the temperature curve trend better prior to disposition.    Acute kidney injury. Improved.   Creatinine improved from 1.49 to down to 0.99.  Holding lisinopril.  Itching of her hands yesterday improved with Benadryl Essential hypertension and tachycardia.  We will switch her blood pressure medication back to her original Norvasc.    Discharge Instructions  Discharge Instructions     Call MD for:  difficulty breathing, headache or visual disturbances   Complete by: As directed    Call MD for:  persistant dizziness or light-headedness   Complete by: As directed    Call MD for:  persistant nausea and vomiting   Complete by: As directed  Call MD for:  temperature >100.4   Complete by: As directed    Diet - low sodium heart healthy   Complete by: As directed    Diet Carb Modified   Complete by: As directed    Discharge instructions   Complete by: As directed    Advised to follow up PCP in one week. Advised to take Levaquin 750 mg daily for 5 days staring 12/24/2019.   Increase activity slowly   Complete by: As directed       Allergies as  of 12/23/2019   No Known Allergies      Medication List     TAKE these medications    amLODipine 5 MG tablet Commonly known as: NORVASC Take 5 mg by mouth daily.   levofloxacin 750 MG tablet Commonly known as: Levaquin Take 1 tablet (750 mg total) by mouth daily for 7 days.   lisinopril 40 MG tablet Commonly known as: ZESTRIL Take 40 mg by mouth daily.   multivitamin with minerals tablet Take 1 tablet by mouth daily.        Follow-up Information     Zipkin, Daniella A, MD Follow up in 1 week(s).   Specialty: Internal Medicine Contact information: [address] [place] [address] [phone]                No Known Allergies  Consultations: None.  Procedures/Studies: CT Renal Stone Study  Result Date: 12/18/2019 CLINICAL DATA:  Left flank and pelvic pain.  Dysuria. EXAM: CT ABDOMEN AND PELVIS WITHOUT CONTRAST TECHNIQUE: Multidetector CT imaging of the abdomen and pelvis was performed following the standard protocol without IV contrast. COMPARISON:  None. FINDINGS: Lower chest: No acute abnormality. Hepatobiliary: No focal liver abnormality is seen. Status post cholecystectomy. No biliary dilatation. Pancreas: Unremarkable. No pancreatic ductal dilatation or surrounding inflammatory changes. Spleen: Normal in size without focal abnormality. Adrenals/Urinary Tract: The adrenal glands are normal. There is fat stranding in the left upper quadrant, likely arising from the left kidney. This finding is asymmetric to the right. No renal stones or hydronephrosis. The ureters are symmetric in caliber. No ureteral stones are identified. The bladder is poorly distended but appears thick walled with adjacent fat stranding. Stomach/Bowel: There is a fat containing mass in the distal gastric body, likely a lipoma. The stomach is otherwise normal. The small bowel is unremarkable. Scattered colonic diverticuli are seen without diverticulitis. The appendix is normal in  appearance. Vascular/Lymphatic: Calcified atherosclerosis in the nonaneurysmal aorta. No adenopathy. Reproductive: Numerous calcified fibroids in the uterus. No adnexal masses. Other: No abdominal wall hernia or abnormality. No abdominopelvic ascites. Musculoskeletal: No acute or significant osseous findings. IMPRESSION: 1. The bladder is thick walled with adjacent stranding suggesting significant cystitis. Recommend clinical correlation and correlation with urinalysis. 2. There is stranding in the left upper quadrant, thought to arise from the left kidney. There is no evidence of obstruction. This could represent sequela of underlying infection/pyelonephritis. The finding could also be seen with a recently passed stone although there is no hydronephrosis on today's study. 3. Colonic diverticulosis without diverticulitis. 4. Calcified atherosclerosis in the nonaneurysmal aorta. 5. Fibroid uterus. 6. 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 Electronically Signed   By: Gerome Sam III M.D   On: 12/18/2019 14:09      Subjective: Patient was seen and examined  At bedside, no overnight events, she feels better, denies  Any fever and wants to go home.  Discharge Exam:  Vitals:   12/22/19 2022 12/23/19 0533  BP: 116/69 125/65  Pulse: 99 76  Resp: 18 18  Temp: 99.7 F (37.6 C) 98.5 F (36.9 C)  SpO2: 98% 98%   Vitals:   12/22/19 0718 12/22/19 1405 12/22/19 2022 12/23/19 0533  BP: 126/77 132/86 116/69 125/65  Pulse: (!) 102 97 99 76  Resp: 18 20 18 18   Temp: 99.6 F (37.6 C) 98.9 F (37.2 C) 99.7 F (37.6 C) 98.5 F (36.9 C)  TempSrc: Oral Oral  Oral  SpO2: 99% 98% 98% 98%  Weight:      Height:        General: Pt is alert, awake, not in acute distress Cardiovascular: RRR, S1/S2 +, no rubs, no gallops Respiratory: CTA bilaterally, no wheezing, no rhonchi Abdominal: Soft, NT, ND, bowel sounds + Extremities: no edema, no cyanosis    The results of  significant diagnostics from this hospitalization (including imaging, microbiology, ancillary and laboratory) are listed below for reference.     Microbiology: Recent Results (from the past 240 hour(s))  Culture, blood (routine x 2)     Status: None   Collection Time: 12/18/19 11:39 AM   Specimen: BLOOD  Result Value Ref Range Status   Specimen Description BLOOD BLOOD RIGHT HAND  Final   Special Requests   Final    BOTTLES DRAWN AEROBIC AND ANAEROBIC Blood Culture results may not be optimal due to an inadequate volume of blood received in culture bottles   Culture   Final    NO GROWTH 5 DAYS Performed at [facility], [address] [place], [place] [address]    Report Status 12/23/2019 FINAL  Final  Culture, blood (routine x 2)     Status: Abnormal   Collection Time: 12/18/19 11:39 AM   Specimen: BLOOD  Result Value Ref Range Status   Specimen Description   Final    BLOOD BLOOD LEFT FOREARM Performed at [facility] [address], [place], [place] [address]    Special Requests   Final    BOTTLES DRAWN AEROBIC AND ANAEROBIC Blood Culture results may not be optimal due to an excessive volume of blood received in culture bottles Performed at [facility], [address], [place], [place] [address]    Culture  Setup Time   Final    Organism ID to follow GRAM NEGATIVE RODS IN BOTH AEROBIC AND ANAEROBIC BOTTLES CRITICAL RESULT CALLED TO, READ BACK BY AND VERIFIED WITH: SCOTT HALL 12/18/19 AT 2359 HS Performed at [facility], [address] [place], [place] [address]    Culture ESCHERICHIA COLI (A)  Final   Report Status 12/21/2019 FINAL  Final   Organism ID, Bacteria ESCHERICHIA COLI  Final      Susceptibility   Escherichia coli - MIC*    AMPICILLIN <=2 SENSITIVE Sensitive     CEFAZOLIN <=4 SENSITIVE Sensitive     CEFEPIME <=0.12 SENSITIVE Sensitive     CEFTAZIDIME <=1 SENSITIVE Sensitive     CEFTRIAXONE <=0.25 SENSITIVE Sensitive      CIPROFLOXACIN <=0.25 SENSITIVE Sensitive     GENTAMICIN <=1 SENSITIVE Sensitive     IMIPENEM <=0.25 SENSITIVE Sensitive     TRIMETH/SULFA >=320 RESISTANT Resistant     AMPICILLIN/SULBACTAM <=2 SENSITIVE Sensitive     PIP/TAZO <=4 SENSITIVE Sensitive     * ESCHERICHIA COLI  Urine culture     Status: Abnormal   Collection Time: 12/18/19 11:39 AM   Specimen: Urine, Clean Catch  Result Value  Ref Range Status   Specimen Description   Final    URINE, CLEAN CATCH Performed at [facility], [address] [place], [place] [address]    Special Requests   Final    NONE Performed at [facility], [address] [place], [place] [address]    Culture >=100,000 COLONIES/mL ESCHERICHIA COLI (A)  Final   Report Status 12/21/2019 FINAL  Final   Organism ID, Bacteria ESCHERICHIA COLI (A)  Final      Susceptibility   Escherichia coli - MIC*    AMPICILLIN <=2 SENSITIVE Sensitive     CEFAZOLIN <=4 SENSITIVE Sensitive     CEFTRIAXONE <=0.25 SENSITIVE Sensitive     CIPROFLOXACIN <=0.25 SENSITIVE Sensitive     GENTAMICIN <=1 SENSITIVE Sensitive     IMIPENEM <=0.25 SENSITIVE Sensitive     NITROFURANTOIN <=16 SENSITIVE Sensitive     TRIMETH/SULFA >=320 RESISTANT Resistant     AMPICILLIN/SULBACTAM <=2 SENSITIVE Sensitive     PIP/TAZO <=4 SENSITIVE Sensitive     * >=100,000 COLONIES/mL ESCHERICHIA COLI  Blood Culture ID Panel (Reflexed)     Status: Abnormal   Collection Time: 12/18/19 11:39 AM  Result Value Ref Range Status   Enterococcus faecalis NOT DETECTED NOT DETECTED Final   Enterococcus Faecium NOT DETECTED NOT DETECTED Final   Listeria monocytogenes NOT DETECTED NOT DETECTED Final   Staphylococcus species NOT DETECTED NOT DETECTED Final   Staphylococcus aureus (BCID) NOT DETECTED NOT DETECTED Final   Staphylococcus epidermidis NOT DETECTED NOT DETECTED Final   Staphylococcus lugdunensis NOT DETECTED NOT DETECTED Final   Streptococcus species NOT DETECTED NOT  DETECTED Final   Streptococcus agalactiae NOT DETECTED NOT DETECTED Final   Streptococcus pneumoniae NOT DETECTED NOT DETECTED Final   Streptococcus pyogenes NOT DETECTED NOT DETECTED Final   A.calcoaceticus-baumannii NOT DETECTED NOT DETECTED Final   Bacteroides fragilis NOT DETECTED NOT DETECTED Final   Enterobacterales DETECTED (A) NOT DETECTED Final    Comment: Enterobacterales represent a large order of gram negative bacteria, not a single organism. CRITICAL RESULT CALLED TO, READ BACK BY AND VERIFIED WITH: SCOTT HALL 12/18/19 AT 2359 HS    Enterobacter cloacae complex NOT DETECTED NOT DETECTED Final   Escherichia coli DETECTED (A) NOT DETECTED Final    Comment: CRITICAL RESULT CALLED TO, READ BACK BY AND VERIFIED WITH: SCOTT HALL 12/18/19 AT 2359 HS    Klebsiella aerogenes NOT DETECTED NOT DETECTED Final   Klebsiella oxytoca NOT DETECTED NOT DETECTED Final   Klebsiella pneumoniae NOT DETECTED NOT DETECTED Final   Proteus species NOT DETECTED NOT DETECTED Final   Salmonella species NOT DETECTED NOT DETECTED Final   Serratia marcescens NOT DETECTED NOT DETECTED Final   Haemophilus influenzae NOT DETECTED NOT DETECTED Final   Neisseria meningitidis NOT DETECTED NOT DETECTED Final   Pseudomonas aeruginosa NOT DETECTED NOT DETECTED Final   Stenotrophomonas maltophilia NOT DETECTED NOT DETECTED Final   Candida albicans NOT DETECTED NOT DETECTED Final   Candida auris NOT DETECTED NOT DETECTED Final   Candida glabrata NOT DETECTED NOT DETECTED Final   Candida krusei NOT DETECTED NOT DETECTED Final   Candida parapsilosis NOT DETECTED NOT DETECTED Final   Candida tropicalis NOT DETECTED NOT DETECTED Final   Cryptococcus neoformans/gattii NOT DETECTED NOT DETECTED Final   CTX-M ESBL NOT DETECTED NOT DETECTED Final   Carbapenem resistance IMP NOT DETECTED NOT DETECTED Final   Carbapenem resistance KPC NOT DETECTED NOT DETECTED Final   Carbapenem resistance NDM NOT DETECTED NOT DETECTED  Final  Carbapenem resist OXA 48 LIKE NOT DETECTED NOT DETECTED Final   Carbapenem resistance VIM NOT DETECTED NOT DETECTED Final    Comment: Performed at [facility], [address] [place], [place] [address]  SARS Coronavirus 2 by RT PCR (hospital order, performed in [facility] hospital lab) Nasopharyngeal Nasopharyngeal Swab     Status: None   Collection Time: 12/18/19  3:00 PM   Specimen: Nasopharyngeal Swab  Result Value Ref Range Status   SARS Coronavirus 2 NEGATIVE NEGATIVE Final    Comment: (NOTE) SARS-CoV-2 target nucleic acids are NOT DETECTED.  The SARS-CoV-2 RNA is generally detectable in upper and lower respiratory specimens during the acute phase of infection. The lowest concentration of SARS-CoV-2 viral copies this assay can detect is 250 copies / mL. A negative result does not preclude SARS-CoV-2 infection and should not be used as the sole basis for treatment or other patient management decisions.  A negative result may occur with improper specimen collection / handling, submission of specimen other than nasopharyngeal swab, presence of viral mutation(s) within the areas targeted by this assay, and inadequate number of viral copies (<250 copies / mL). A negative result must be combined with clinical observations, patient history, and epidemiological information.  Fact Sheet for Patients:   BoilerBrush.com.cy  Fact Sheet for Healthcare Providers: https://pope.com/  This test is not yet approved or  cleared by the [place] FDA and has been authorized for detection and/or diagnosis of SARS-CoV-2 by FDA under an Emergency Use Authorization (EUA).  This EUA will remain in effect (meaning this test can be used) for the duration of the COVID-19 declaration under Section 564(b)(1) of the Act, 21 U.S.C. section 360bbb-3(b)(1), unless the authorization is terminated or revoked sooner.  Performed at [facility], [address] [place], [place] [address]      Labs: BNP (last 3 results) No results for input(s): BNP in the last 8760 hours. Basic Metabolic Panel: Recent Labs  Lab 12/18/19 1139 12/19/19 0727 12/20/19 0807 12/22/19 0602  NA 139 137 137 138  K 3.9 3.5 3.9 3.5  CL 100 105 103 103  CO2 22 21* 21* 25  GLUCOSE 137* 135* 114* 111*  BUN CREATININE 1.49* 1.38* 0.99 0.78  CALCIUM 9.6 8.6* 8.6* 8.7*   Liver Function Tests: Recent Labs  Lab 12/18/19 1139  AST 32  ALT 25  ALKPHOS 76  BILITOT 1.2  PROT 8.4*  ALBUMIN 4.0   Recent Labs  Lab 12/18/19 1139  LIPASE 21   No results for input(s): AMMONIA in the last 168 hours. CBC: Recent Labs  Lab 12/18/19 1139 12/19/19 0727 12/20/19 0807 12/22/19 0602  WBC 21.3* 21.5* 15.8* 13.3*  HGB 15.2* 13.2 13.0 12.7  HCT 45.9 40.1 37.7 37.1  MCV 91.8 91.6 88.7 86.7  PLT 223 195 202 263   Cardiac Enzymes: No results for input(s): CKTOTAL, CKMB, CKMBINDEX, TROPONINI in the last 168 hours. BNP: Invalid input(s): POCBNP CBG: No results for input(s): GLUCAP in the last 168 hours. D-Dimer No results for input(s): DDIMER in the last 72 hours. Hgb A1c No results for input(s): HGBA1C in the last 72 hours. Lipid Profile No results for input(s): CHOL, HDL, LDLCALC, TRIG, CHOLHDL, LDLDIRECT in the last 72 hours. Thyroid function studies No results for input(s): TSH, T4TOTAL, T3FREE, THYROIDAB in the last 72 hours.  Invalid input(s): FREET3 Anemia work up No results for input(s): VITAMINB12, FOLATE, FERRITIN, TIBC, IRON, RETICCTPCT in the last 72 hours.  Urinalysis    Component Value Date/Time   COLORURINE YELLOW (A) 12/18/2019 1500   APPEARANCEUR TURBID (A) 12/18/2019 1500   LABSPEC 1.013 12/18/2019 1500   PHURINE 5.0 12/18/2019 1500   GLUCOSEU NEGATIVE 12/18/2019 1500   HGBUR MODERATE (A) 12/18/2019 1500   BILIRUBINUR NEGATIVE 12/18/2019 1500   KETONESUR NEGATIVE 12/18/2019 1500   PROTEINUR 100  (A) 12/18/2019 1500   NITRITE NEGATIVE 12/18/2019 1500   LEUKOCYTESUR LARGE (A) 12/18/2019 1500   Sepsis Labs Invalid input(s): PROCALCITONIN,  WBC,  LACTICIDVEN Microbiology Recent Results (from the past 240 hour(s))  Culture, blood (routine x 2)     Status: None   Collection Time: 12/18/19 11:39 AM   Specimen: BLOOD  Result Value Ref Range Status   Specimen Description BLOOD BLOOD RIGHT HAND  Final   Special Requests   Final    BOTTLES DRAWN AEROBIC AND ANAEROBIC Blood Culture results may not be optimal due to an inadequate volume of blood received in culture bottles   Culture   Final    NO GROWTH 5 DAYS Performed at [facility], [address] [place], [place] [address]    Report Status 12/23/2019 FINAL  Final  Culture, blood (routine x 2)     Status: Abnormal   Collection Time: 12/18/19 11:39 AM   Specimen: BLOOD  Result Value Ref Range Status   Specimen Description   Final    BLOOD BLOOD LEFT FOREARM Performed at [facility], [address], [place], [place] [address]    Special Requests   Final    BOTTLES DRAWN AEROBIC AND ANAEROBIC Blood Culture results may not be optimal due to an excessive volume of blood received in culture bottles Performed at [facility], [address] [place], [place] [address]    Culture  Setup Time   Final    Organism ID to follow GRAM NEGATIVE RODS IN BOTH AEROBIC AND ANAEROBIC BOTTLES CRITICAL RESULT CALLED TO, READ BACK BY AND VERIFIED WITH: SCOTT HALL 12/18/19 AT 2359 HS Performed at [facility], [address] [place], [place] [address]    Culture ESCHERICHIA COLI (A)  Final   Report Status 12/21/2019 FINAL  Final   Organism ID, Bacteria ESCHERICHIA COLI  Final      Susceptibility   Escherichia coli - MIC*    AMPICILLIN <=2 SENSITIVE Sensitive     CEFAZOLIN <=4 SENSITIVE Sensitive     CEFEPIME <=0.12 SENSITIVE Sensitive     CEFTAZIDIME <=1 SENSITIVE Sensitive     CEFTRIAXONE <=0.25  SENSITIVE Sensitive     CIPROFLOXACIN <=0.25 SENSITIVE Sensitive     GENTAMICIN <=1 SENSITIVE Sensitive     IMIPENEM <=0.25 SENSITIVE Sensitive     TRIMETH/SULFA >=320 RESISTANT Resistant     AMPICILLIN/SULBACTAM <=2 SENSITIVE Sensitive     PIP/TAZO <=4 SENSITIVE Sensitive     * ESCHERICHIA COLI  Urine culture     Status: Abnormal   Collection Time: 12/18/19 11:39 AM   Specimen: Urine, Clean Catch  Result Value Ref Range Status   Specimen Description   Final    URINE, CLEAN CATCH Performed at [facility], [address] [place], [place] [address]    Special Requests   Final    NONE Performed at [facility], [address] [place], [place] [address]    Culture >=100,000 COLONIES/mL ESCHERICHIA COLI (A)  Final   Report Status 12/21/2019 FINAL  Final   Organism ID, Bacteria ESCHERICHIA COLI (A)  Final  Susceptibility   Escherichia coli - MIC*    AMPICILLIN <=2 SENSITIVE Sensitive     CEFAZOLIN <=4 SENSITIVE Sensitive     CEFTRIAXONE <=0.25 SENSITIVE Sensitive     CIPROFLOXACIN <=0.25 SENSITIVE Sensitive     GENTAMICIN <=1 SENSITIVE Sensitive     IMIPENEM <=0.25 SENSITIVE Sensitive     NITROFURANTOIN <=16 SENSITIVE Sensitive     TRIMETH/SULFA >=320 RESISTANT Resistant     AMPICILLIN/SULBACTAM <=2 SENSITIVE Sensitive     PIP/TAZO <=4 SENSITIVE Sensitive     * >=100,000 COLONIES/mL ESCHERICHIA COLI  Blood Culture ID Panel (Reflexed)     Status: Abnormal   Collection Time: 12/18/19 11:39 AM  Result Value Ref Range Status   Enterococcus faecalis NOT DETECTED NOT DETECTED Final   Enterococcus Faecium NOT DETECTED NOT DETECTED Final   Listeria monocytogenes NOT DETECTED NOT DETECTED Final   Staphylococcus species NOT DETECTED NOT DETECTED Final   Staphylococcus aureus (BCID) NOT DETECTED NOT DETECTED Final   Staphylococcus epidermidis NOT DETECTED NOT DETECTED Final   Staphylococcus lugdunensis NOT DETECTED NOT DETECTED Final   Streptococcus species NOT  DETECTED NOT DETECTED Final   Streptococcus agalactiae NOT DETECTED NOT DETECTED Final   Streptococcus pneumoniae NOT DETECTED NOT DETECTED Final   Streptococcus pyogenes NOT DETECTED NOT DETECTED Final   A.calcoaceticus-baumannii NOT DETECTED NOT DETECTED Final   Bacteroides fragilis NOT DETECTED NOT DETECTED Final   Enterobacterales DETECTED (A) NOT DETECTED Final    Comment: Enterobacterales represent a large order of gram negative bacteria, not a single organism. CRITICAL RESULT CALLED TO, READ BACK BY AND VERIFIED WITH: SCOTT HALL 12/18/19 AT 2359 HS    Enterobacter cloacae complex NOT DETECTED NOT DETECTED Final   Escherichia coli DETECTED (A) NOT DETECTED Final    Comment: CRITICAL RESULT CALLED TO, READ BACK BY AND VERIFIED WITH: SCOTT HALL 12/18/19 AT 2359 HS    Klebsiella aerogenes NOT DETECTED NOT DETECTED Final   Klebsiella oxytoca NOT DETECTED NOT DETECTED Final   Klebsiella pneumoniae NOT DETECTED NOT DETECTED Final   Proteus species NOT DETECTED NOT DETECTED Final   Salmonella species NOT DETECTED NOT DETECTED Final   Serratia marcescens NOT DETECTED NOT DETECTED Final   Haemophilus influenzae NOT DETECTED NOT DETECTED Final   Neisseria meningitidis NOT DETECTED NOT DETECTED Final   Pseudomonas aeruginosa NOT DETECTED NOT DETECTED Final   Stenotrophomonas maltophilia NOT DETECTED NOT DETECTED Final   Candida albicans NOT DETECTED NOT DETECTED Final   Candida auris NOT DETECTED NOT DETECTED Final   Candida glabrata NOT DETECTED NOT DETECTED Final   Candida krusei NOT DETECTED NOT DETECTED Final   Candida parapsilosis NOT DETECTED NOT DETECTED Final   Candida tropicalis NOT DETECTED NOT DETECTED Final   Cryptococcus neoformans/gattii NOT DETECTED NOT DETECTED Final   CTX-M ESBL NOT DETECTED NOT DETECTED Final   Carbapenem resistance IMP NOT DETECTED NOT DETECTED Final   Carbapenem resistance KPC NOT DETECTED NOT DETECTED Final   Carbapenem resistance NDM NOT DETECTED  NOT DETECTED Final   Carbapenem resist OXA 48 LIKE NOT DETECTED NOT DETECTED Final   Carbapenem resistance VIM NOT DETECTED NOT DETECTED Final    Comment: Performed at [facility], [address] [place], [place] [address]  SARS Coronavirus 2 by RT PCR (hospital order, performed in [facility] hospital lab) Nasopharyngeal Nasopharyngeal Swab     Status: None   Collection Time: 12/18/19  3:00 PM   Specimen: Nasopharyngeal Swab  Result Value Ref Range Status   SARS Coronavirus 2 NEGATIVE  NEGATIVE Final    Comment: (NOTE) SARS-CoV-2 target nucleic acids are NOT DETECTED.  The SARS-CoV-2 RNA is generally detectable in upper and lower respiratory specimens during the acute phase of infection. The lowest concentration of SARS-CoV-2 viral copies this assay can detect is 250 copies / mL. A negative result does not preclude SARS-CoV-2 infection and should not be used as the sole basis for treatment or other patient management decisions.  A negative result may occur with improper specimen collection / handling, submission of specimen other than nasopharyngeal swab, presence of viral mutation(s) within the areas targeted by this assay, and inadequate number of viral copies (<250 copies / mL). A negative result must be combined with clinical observations, patient history, and epidemiological information.  Fact Sheet for Patients:   BoilerBrush.com.cyhttps://www.fda.gov/media/136312/download  Fact Sheet for Healthcare Providers: https://pope.com/https://www.fda.gov/media/136313/download  This test is not yet approved or  cleared by the [place] FDA and has been authorized for detection and/or diagnosis of SARS-CoV-2 by FDA under an Emergency Use Authorization (EUA).  This EUA will remain in effect (meaning this test can be used) for the duration of the COVID-19 declaration under Section 564(b)(1) of the Act, 21 U.S.C. section 360bbb-3(b)(1), unless the authorization is terminated or revoked sooner.  Performed  at [facility], [address] [place], [place] [address]      Time coordinating discharge: Over 30 minutes  SIGNED:   Cipriano BunkerPARDEEP Severa Jeremiah, MD  [facility] 12/23/2019, 11:40 AM Pager   If 7PM-7AM, please contact night-coverage [email or web address]

## 2019-12-23 NOTE — Discharge Instructions (Signed)
Sepsis, Diagnosis, Adult Sepsis is a serious bodily reaction to an infection. The infection that triggers sepsis may be from a bacteria, virus, or fungus. Sepsis can result from an infection in any part of your body. Infections that commonly lead to sepsis include skin, lung, and urinary tract infections. Sepsis is a medical emergency that must be treated right away in a hospital. In severe cases, it can lead to septic shock. Septic shock can weaken your heart and cause your blood pressure to drop. This can cause your central nervous system and your body's organs to stop working. What are the causes? This condition is caused by a severe reaction to infections from bacteria, viruses, or fungus. The germs that most often lead to sepsis include:  Escherichia coli (E. coli) bacteria.  Staphylococcus aureus (staph) bacteria.  Some types of Streptococcus bacteria. The most common infections affect these organs:  The lung (pneumonia).  The kidneys or bladder (urinary tract infection).  The skin (cellulitis).  The bowel, gallbladder, or pancreas. What increases the risk? You are more likely to develop this condition if:  Your body's disease-fighting system (immune system) is weakened.  You are age 36 or older.  You are female.  You had surgery or you have been hospitalized.  You have these devices inserted into your body: ? A small, thin tube (catheter). ? IV line. ? Breathing tube. ? Drainage tube.  You are not getting enough nutrients from food (malnourished).  You have a long-term (chronic) disease, such as cancer, lung disease, kidney disease, or diabetes.  You are African American. What are the signs or symptoms? Symptoms of this condition may include:  Fever.  Chills or feeling very cold.  Confusion or anxiety.  Fatigue.  Muscle aches.  Shortness of breath.  Nausea and vomiting.  Urinating much less than usual.  Fast heart rate (tachycardia).  Rapid  breathing (hyperventilation).  Changes in skin color. Your skin may look blotchy, pale, or blue.  Cool, clammy, or sweaty skin.  Skin rash. Other symptoms depend on the source of your infection. How is this diagnosed? This condition is diagnosed based on:  Your symptoms.  Your medical history.  A physical exam. Other tests may also be done to find out the cause of the infection and how severe the sepsis is. These tests may include:  Blood tests.  Urine tests.  Swabs from other areas of your body that may have an infection. These samples may be tested (cultured) to find out what type of bacteria is causing the infection.  Chest X-ray to check for pneumonia. Other imaging tests, such as a CT scan, may also be done.  Lumbar puncture. This removes a small amount of the fluid that surrounds your brain and spinal cord. The fluid is then examined for infection. How is this treated? This condition must be treated in a hospital. Based on the cause of your infection, you may be given an antibiotic, antiviral, or antifungal medicine. You may also receive:  Fluids through an IV.  Oxygen and breathing assistance.  Medicines to increase your blood pressure.  Kidney dialysis. This process cleans your blood if your kidneys have failed.  Surgery to remove infected tissue.  Blood transfusion if needed.  Medicine to prevent blood clots.  Nutrients to correct imbalances in basic body function (metabolism). You may: ? Receive important salts and minerals (electrolytes) through an IV. ? Have your blood sugar level adjusted. Follow these instructions at home: Medicines   Take over-the-counter  and prescription medicines only as told by your health care provider.  If you were prescribed an antibiotic, antiviral, or antifungal medicine, take it as told by your health care provider. Do not stop taking the medicine even if you start to feel better. General instructions  If you have a  catheter or other indwelling device, ask to have it removed as soon as possible.  Keep all follow-up visits as told by your health care provider. This is important. Contact a health care provider if:  You do not feel like you are getting better or regaining strength.  You are having trouble coping with your recovery.  You frequently feel tired.  You feel worse or do not seem to get better after surgery.  You think you may have an infection after surgery. Get help right away if:  You have any symptoms of sepsis.  You have difficulty breathing.  You have a rapid or skipping heartbeat.  You become confused or disoriented.  You have a high fever.  Your skin becomes blotchy, pale, or blue.  You have an infection that is getting worse or not getting better. These symptoms may represent a serious problem that is an emergency. Do not wait to see if the symptoms will go away. Get medical help right away. Call your local emergency services (911 in the [place] Do not drive yourself to the hospital. Summary  Sepsis is a medical emergency that requires immediate treatment in a hospital.  This condition is caused by a severe reaction to infections from bacteria, viruses, or fungus.  Based on the cause of your infection, you may be given an antibiotic, antiviral, or antifungal medicine.  Treatment may also include IV fluids, breathing assistance, and kidney dialysis. This information is not intended to replace advice given to you by your health care provider. Make sure you discuss any questions you have with your health care provider. Document Revised: 11/21/2017 Document Reviewed: 11/21/2017 Elsevier Patient Education  2020 [employer]. Advised to follow up PCP in one week. Advised to take Levaquin 750 mg daily for 5 days staring 12/24/2019.

## 2021-12-09 IMAGING — CT CT RENAL STONE PROTOCOL
2 of 4 series · 16 of 46 positions shown, 18 images · non-contrast
Comparison: None.

CLINICAL DATA: Left flank and pelvic pain.  Dysuria.

EXAM:
CT ABDOMEN AND PELVIS WITHOUT CONTRAST
TECHNIQUE: Multidetector CT imaging of the abdomen and pelvis was performed
following the standard protocol without IV contrast.

[Series 2: stone full standard · axial · 0.68mm/px · z∈[-1196,-760]mm · 13 of 95 slices shown, 15 images]
[im 4/95  soft-tissue]
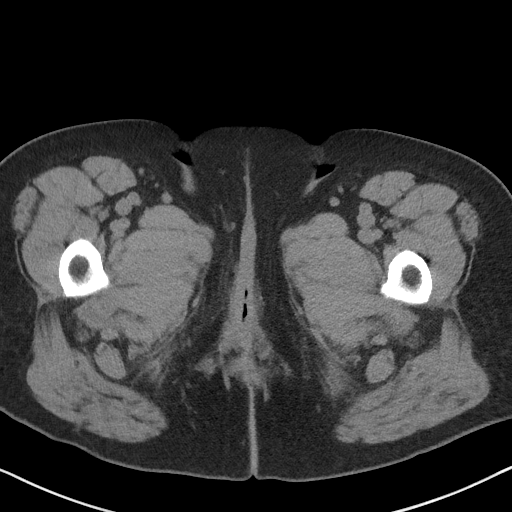
[im 4/95  bone]
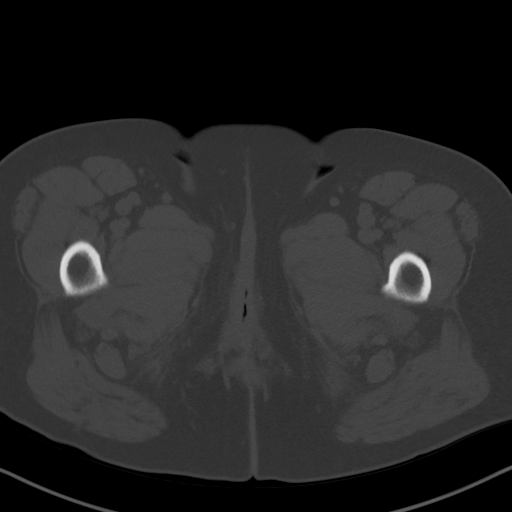
[im 11/95  soft-tissue]
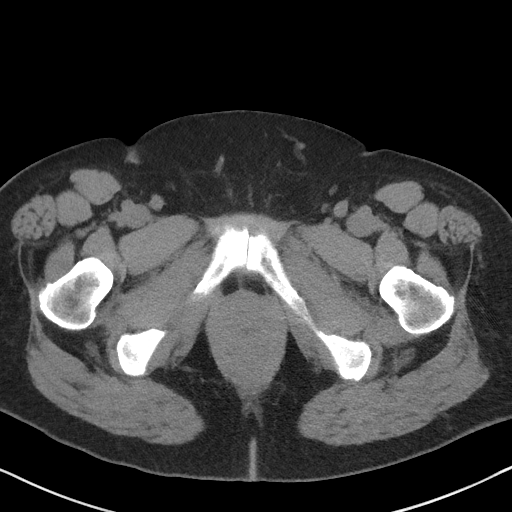
[im 19/95  soft-tissue]
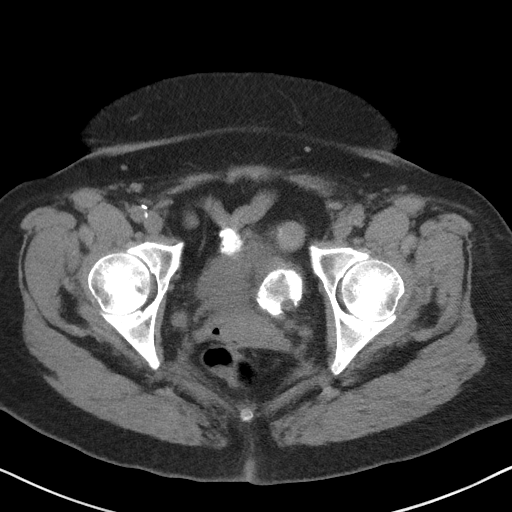
[im 26/95  soft-tissue]
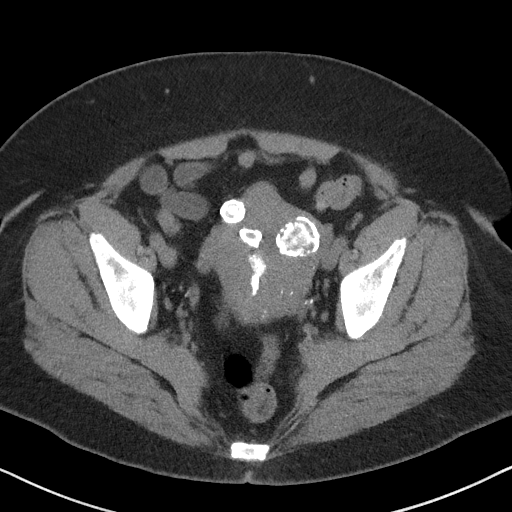
[im 33/95  soft-tissue]
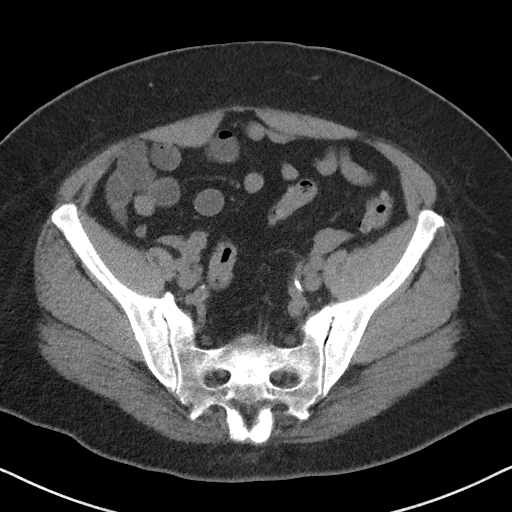
[im 40/95  soft-tissue]
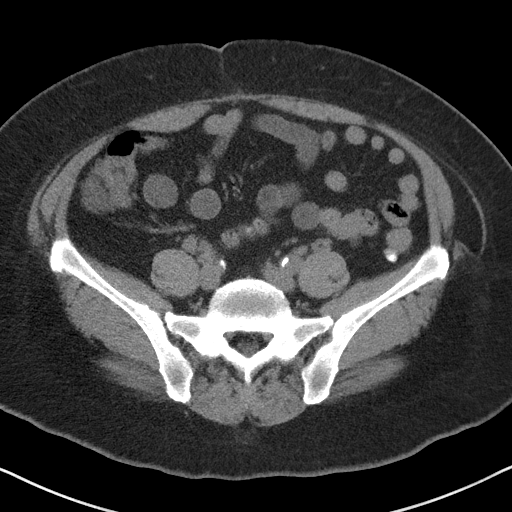
[im 48/95  soft-tissue]
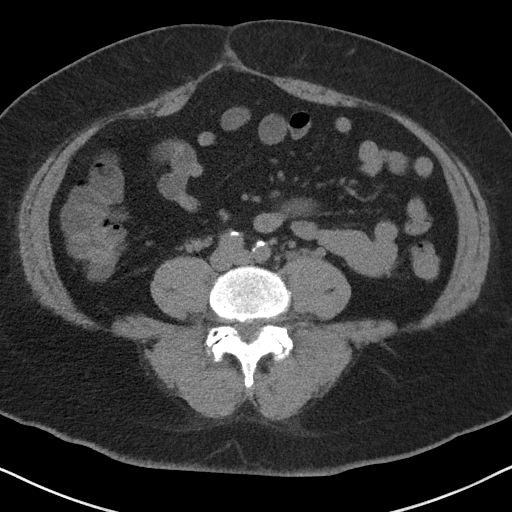
[im 55/95  soft-tissue]
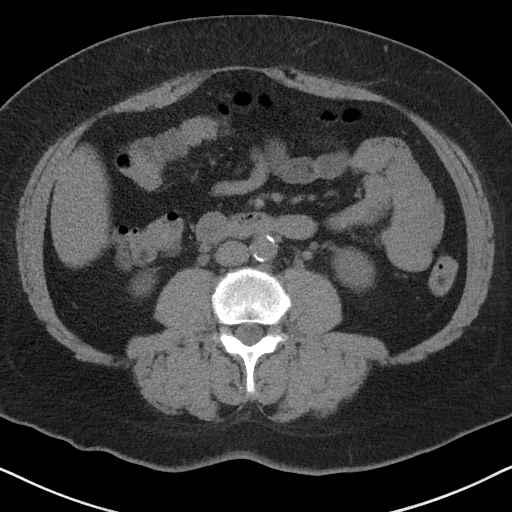
[im 62/95  soft-tissue]
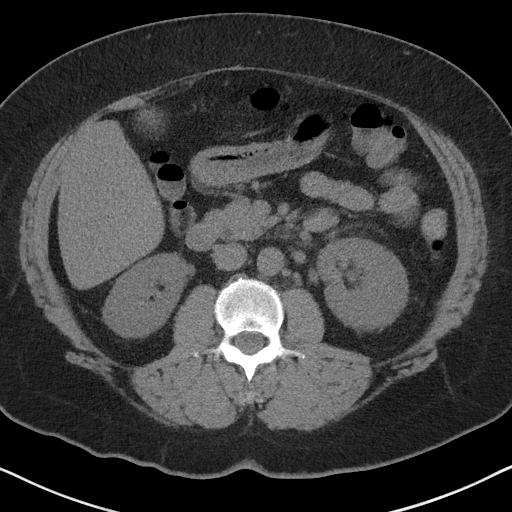
[im 62/95  bone]
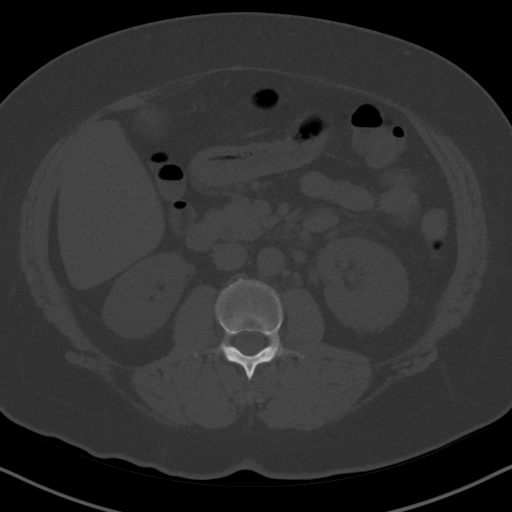
[im 69/95  soft-tissue]
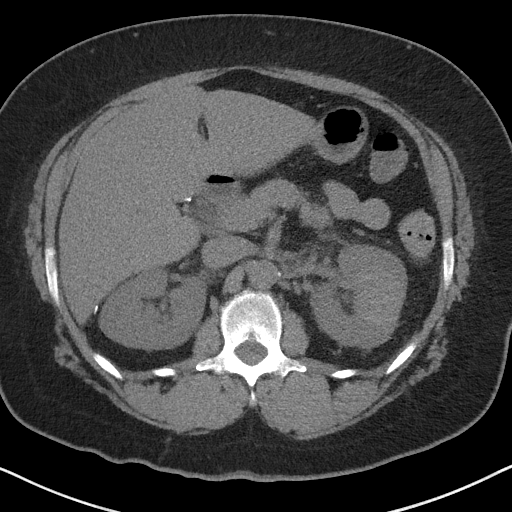
[im 76/95  soft-tissue]
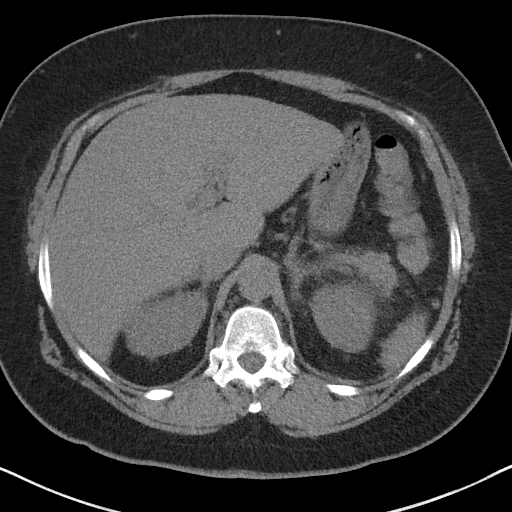
[im 84/95  soft-tissue]
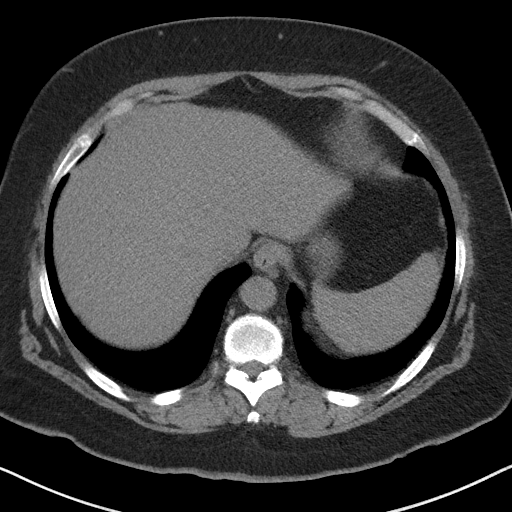
[im 91/95  soft-tissue]
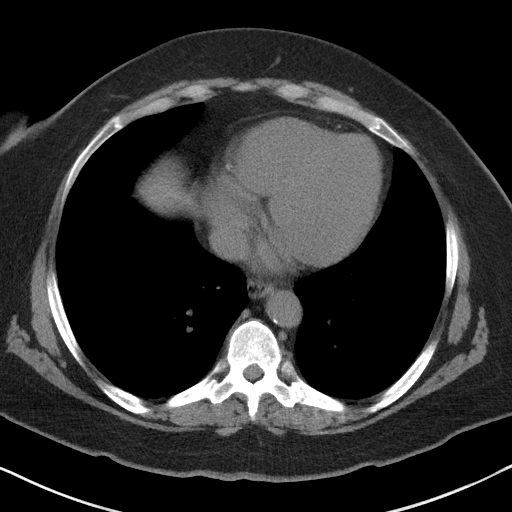

[Series 5: coronal · coronal · 0.85mm/px · 3 of 160 slices shown]
[im 54/160  soft-tissue]
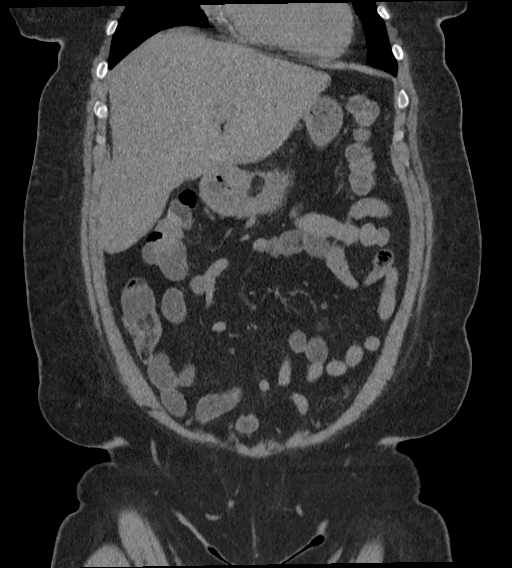
[im 71/160  soft-tissue]
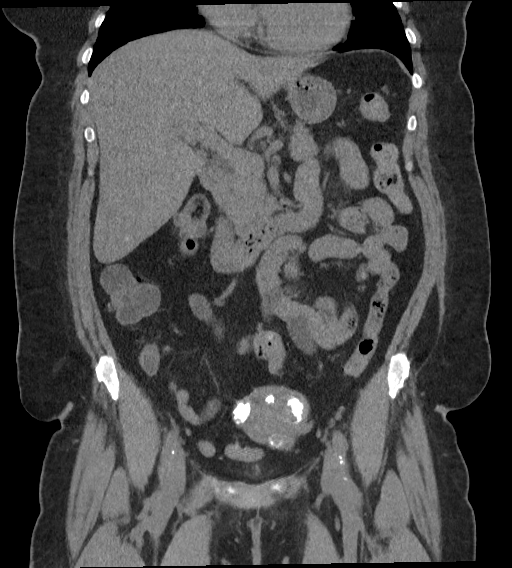
[im 89/160  soft-tissue]
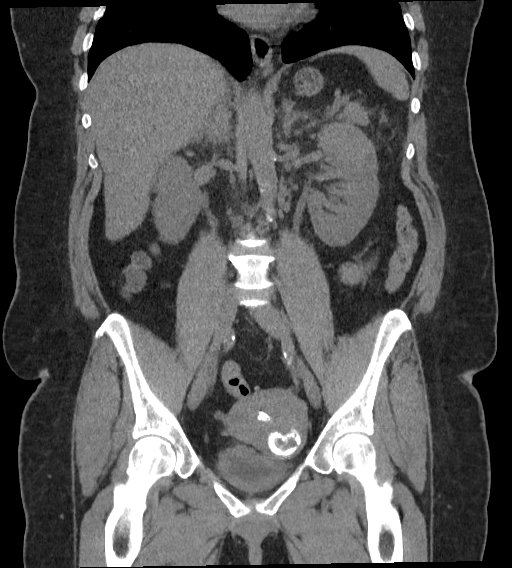

[16 of 46 positions shown; findings below may reference images not displayed]

FINDINGS: Lower chest: No acute abnormality.

Hepatobiliary: No focal liver abnormality is seen. Status post
cholecystectomy. No biliary dilatation.

Pancreas: Unremarkable. No pancreatic ductal dilatation or
surrounding inflammatory changes.

Spleen: Normal in size without focal abnormality.

Adrenals/Urinary Tract: The adrenal glands are normal. There is fat
stranding in the left upper quadrant, likely arising from the left
kidney. This finding is asymmetric to the right. No renal stones or
hydronephrosis. The ureters are symmetric in caliber. No ureteral
stones are identified. The bladder is poorly distended but appears
thick walled with adjacent fat stranding.

Stomach/Bowel: There is a fat containing mass in the distal gastric
body, likely a lipoma. The stomach is otherwise normal. The small
bowel is unremarkable. Scattered colonic diverticuli are seen
without diverticulitis. The appendix is normal in appearance.

Vascular/Lymphatic: Calcified atherosclerosis in the nonaneurysmal
aorta. No adenopathy.

Reproductive: Numerous calcified fibroids in the uterus. No adnexal
masses.

Other: No abdominal wall hernia or abnormality. No abdominopelvic
ascites.

Musculoskeletal: No acute or significant osseous findings.
IMPRESSION: 1. The bladder is thick walled with adjacent stranding suggesting
significant cystitis. Recommend clinical correlation and correlation
with urinalysis.
2. There is stranding in the left upper quadrant, thought to arise
from the left kidney. There is no evidence of obstruction. This
could represent sequela of underlying infection/pyelonephritis. The
finding could also be seen with a recently passed stone although
there is no hydronephrosis on today's study.
3. Colonic diverticulosis without diverticulitis.
4. Calcified atherosclerosis in the nonaneurysmal aorta.
5. Fibroid uterus.
6. 1.8 cm fatty mass in the distal gastric body, likely a gastric
lipoma.

## 2023-07-06 ENCOUNTER — Other Ambulatory Visit: Payer: Self-pay

## 2023-07-06 ENCOUNTER — Emergency Department
Admission: EM | Admit: 2023-07-06 | Discharge: 2023-07-06 | Disposition: A | Discharge status: Home / Self Care | Attending: Emergency Medicine | Admitting: Emergency Medicine

## 2023-07-06 ENCOUNTER — Encounter: Payer: Self-pay | Admitting: Emergency Medicine

## 2023-07-06 DIAGNOSIS — M5441 Lumbago with sciatica, right side: Secondary | ICD-10-CM | POA: Diagnosis not present

## 2023-07-06 DIAGNOSIS — M545 Low back pain, unspecified: Secondary | ICD-10-CM | POA: Diagnosis present

## 2023-07-06 DIAGNOSIS — X500XXA Overexertion from strenuous movement or load, initial encounter: Secondary | ICD-10-CM | POA: Insufficient documentation

## 2023-07-06 MED ORDER — CYCLOBENZAPRINE HCL 10 MG PO TABS: 10.0000 mg | ORAL_TABLET | Freq: Three times a day (TID) | ORAL | 0 refills | Status: DC | PRN

## 2023-07-06 MED ORDER — LIDOCAINE 5 % EX PTCH
1.0000 | MEDICATED_PATCH | CUTANEOUS | Status: DC
  Administered 2023-07-06: 1 via TRANSDERMAL
  Filled 2023-07-06: qty 1

## 2023-07-06 MED ORDER — DIAZEPAM 5 MG PO TABS
10.0000 mg | ORAL_TABLET | Freq: Once | ORAL | Status: AC
  Administered 2023-07-06: 10 mg via ORAL
  Filled 2023-07-06: qty 2

## 2023-07-06 MED ORDER — KETOROLAC TROMETHAMINE 15 MG/ML IJ SOLN
15.0000 mg | Freq: Once | INTRAMUSCULAR | Status: AC
  Administered 2023-07-06: 15 mg via INTRAMUSCULAR
  Filled 2023-07-06: qty 1

## 2023-07-06 NOTE — ED Provider Notes (Signed)
 [facility] Provider Note    Event Date/Time   First MD Initiated Contact with Patient 07/06/23 1304     (approximate)   History   Back Pain   HPI  Anita Best is a 63 year old female presenting to the emergency department for evaluation of back pain.  On Friday, patient was moving when she lifted a heavy box and had onset of right lower back pain.  No falls.  Pain is located in her right lower back and gluteal region with radiation down part of her leg.  No falls, head trauma, injury to other areas.  Has been taking Aleve with some improvement.  No recent fevers.  No history of malignancy or IVDU.  No bowel or bladder incontinence or difficulty emptying bladder.    Physical Exam   Triage Vital Signs: ED Triage Vitals  Encounter Vitals Group     BP 07/06/23 1255 (!) 142/65     Systolic BP Percentile --      Diastolic BP Percentile --      Pulse Rate 07/06/23 1255 75     Resp 07/06/23 1255 17     Temp 07/06/23 1255 97.7 F (36.5 C)     Temp src --      SpO2 07/06/23 1255 100 %     Weight 07/06/23 1253 220 lb (99.8 kg)     Height 07/06/23 1253 5\' 6"  (1.676 m)     Head Circumference --      Peak Flow --      Pain Score 07/06/23 1253 8     Pain Loc --      Pain Education --      Exclude from Growth Chart --     Most recent vital signs: Vitals:   07/06/23 1255 07/06/23 1415  BP: (!) 142/65 127/61  Pulse: 75 80  Resp: 17 18  Temp: 97.7 F (36.5 C)   SpO2: 100% 98%     General: Awake, interactive  CV:  Regular rate, good peripheral perfusion.  Resp:  Unlabored respirations Abd:  Nondistended.  Neuro:  Symmetric facial movement, fluid speech MSK:  No midline tenderness of the spine.  Reproducible tenderness over the right lower paraspinous muscles.  5 out of 5 strength of the bilateral lower extremities.  2+ DP pulses.  Intact sensation.   ED Results / Procedures / Treatments   Labs (all labs ordered are listed, but only abnormal  results are displayed) Labs Reviewed - No data to display   EKG EKG independently reviewed interpreted by myself (ER attending) demonstrates:    RADIOLOGY Imaging independently reviewed and interpreted by myself demonstrates:    PROCEDURES:  Critical Care performed: No  Procedures   MEDICATIONS ORDERED IN ED: Medications  lidocaine (LIDODERM) 5 % 1 patch (1 patch Transdermal Patch Applied 07/06/23 1408)  ketorolac (TORADOL) 15 MG/ML injection 15 mg (15 mg Intramuscular Given 07/06/23 1405)  diazepam (VALIUM) tablet 10 mg (10 mg Oral Given 07/06/23 1404)     IMPRESSION / MDM / ASSESSMENT AND PLAN / ED COURSE  I reviewed the triage vital signs and the nursing notes.  Differential diagnosis includes, but is not limited to, muscle strain, sciatica, no evidence of spinal cord compression, very low suspicion fracture based on history  Patient's presentation is most consistent with acute, uncomplicated illness.  63 year old female presenting with right lower back pain in the setting of recent heavy lifting.  Stable vitals on presentation.  No red flag back symptoms.  Treated with multimodal pain control with significant improvement in symptoms.  Patient is comfortable with discharge home.  Will DC with prescription for Flexeril.  Strict return precautions provided.  Patient discharged stable condition.      FINAL CLINICAL IMPRESSION(S) / ED DIAGNOSES   Final diagnoses:  Acute right-sided low back pain with right-sided sciatica     Rx / DC Orders   ED Discharge Orders          Ordered    cyclobenzaprine (FLEXERIL) 10 MG tablet  3 times daily PRN        07/06/23 1439             Note:  This document was prepared using Dragon voice recognition software and may include unintentional dictation errors.   Trinna Post, MD 07/06/23 [phone]

## 2023-07-06 NOTE — ED Triage Notes (Signed)
 Patient to ED via POV for right sided lower back pain. Started on Friday when she was lifting a box while moving. Using walker since injury. Taking aleve at home for pain.

## 2023-07-06 NOTE — Discharge Instructions (Signed)
You were seen in the emergency department today for evaluation of your back pain. Fortunately, your exam here is reassuring. You can take Tylenol and Ibuprofen to help with your pain, unless there is another reason you should not take these. You can also try lidocaine patches that are available over-the-counter.   We will send a prescription for a muscle relaxer to your pharmacy. Do not drive or operate machinery when taking this medication. Follow-up with your primary care provider within a few days for reevaluation. Return to the ER for any worsening symptoms including numbness, tingling, weakness, bowel or bladder incontinence, or any other new or concerning symptoms.

## 2023-07-17 ENCOUNTER — Emergency Department
Admission: EM | Admit: 2023-07-17 | Discharge: 2023-07-17 | Disposition: A | Discharge status: Home / Self Care | Attending: Emergency Medicine | Admitting: Emergency Medicine

## 2023-07-17 ENCOUNTER — Encounter: Payer: Self-pay | Admitting: Intensive Care

## 2023-07-17 ENCOUNTER — Other Ambulatory Visit: Payer: Self-pay

## 2023-07-17 DIAGNOSIS — I10 Essential (primary) hypertension: Secondary | ICD-10-CM | POA: Diagnosis not present

## 2023-07-17 DIAGNOSIS — M5431 Sciatica, right side: Secondary | ICD-10-CM

## 2023-07-17 DIAGNOSIS — M545 Low back pain, unspecified: Secondary | ICD-10-CM | POA: Diagnosis present

## 2023-07-17 DIAGNOSIS — M5441 Lumbago with sciatica, right side: Secondary | ICD-10-CM | POA: Diagnosis not present

## 2023-07-17 MED ORDER — CYCLOBENZAPRINE HCL 10 MG PO TABS
10.0000 mg | ORAL_TABLET | Freq: Once | ORAL | Status: AC
  Administered 2023-07-17: 10 mg via ORAL
  Filled 2023-07-17: qty 1

## 2023-07-17 MED ORDER — LIDOCAINE 5 % EX PTCH: 1.0000 | MEDICATED_PATCH | CUTANEOUS | 0 refills | Status: AC

## 2023-07-17 MED ORDER — ACETAMINOPHEN 500 MG PO TABS
1000.0000 mg | ORAL_TABLET | Freq: Once | ORAL | Status: AC
  Administered 2023-07-17: 1000 mg via ORAL
  Filled 2023-07-17: qty 2

## 2023-07-17 MED ORDER — CYCLOBENZAPRINE HCL 10 MG PO TABS: 10.0000 mg | ORAL_TABLET | Freq: Three times a day (TID) | ORAL | 0 refills | Status: AC | PRN

## 2023-07-17 MED ORDER — KETOROLAC TROMETHAMINE 30 MG/ML IJ SOLN
15.0000 mg | Freq: Once | INTRAMUSCULAR | Status: AC
  Administered 2023-07-17: 15 mg via INTRAMUSCULAR
  Filled 2023-07-17: qty 1

## 2023-07-17 MED ORDER — LIDOCAINE 5 % EX PTCH
1.0000 | MEDICATED_PATCH | CUTANEOUS | Status: DC
  Administered 2023-07-17: 1 via TRANSDERMAL
  Filled 2023-07-17: qty 1

## 2023-07-17 NOTE — Discharge Instructions (Signed)
 You can take 650 mg of Tylenol or 400 mg of ibuprofen every 6 hours as needed for pain.  When you are using the Flexeril, please not drive or operate heavy machinery.  If the Lidoderm patch is not working for you you can also buy over-the-counter Salonpas.

## 2023-07-17 NOTE — ED Provider Notes (Signed)
 [facility] Provider Note    Event Date/Time   First MD Initiated Contact with Patient 07/17/23 1927     (approximate)   History   Hip Pain   HPI  Anita Best is a 63 y.o. female hypertension, presenting with pain to her right glutes that radiates down her right leg.  States that she has been having the symptoms intermittently, has not been formally diagnosed with sciatica.  States that 2 weeks ago she was moving furniture, noted the pain, was seen here in the emergency department and discharged with pain meds.  States that her pain has improved but recurred in the last few days since she thinks that she has been sitting a lot for work.  No weakness or numbness, no incontinence.  No recent trauma or falls.  No history of malignancy, no history of IV drug use.  No instrumentation to her back recently.  No urinary symptoms, no nausea, vomiting, diarrhea, abdominal pain, chest pain, shortness of breath, fever.  States that she has been taking Tylenol and ibuprofen without significant relief.     Physical Exam   Triage Vital Signs: ED Triage Vitals  Encounter Vitals Group     BP 07/17/23 1527 (!) 163/71     Systolic BP Percentile --      Diastolic BP Percentile --      Pulse Rate 07/17/23 1527 (!) 107     Resp 07/17/23 1527 16     Temp 07/17/23 1527 98.3 F (36.8 C)     Temp Source 07/17/23 1527 Oral     SpO2 07/17/23 1527 100 %     Weight 07/17/23 1528 220 lb (99.8 kg)     Height 07/17/23 1528 5\' 6"  (1.676 m)     Head Circumference --      Peak Flow --      Pain Score 07/17/23 1528 9     Pain Loc --      Pain Education --      Exclude from Growth Chart --     Most recent vital signs: Vitals:   07/17/23 1527 07/17/23 2040  BP: (!) 163/71 (!) 130/90  Pulse: (!) 107 90  Resp: 16 18  Temp: 98.3 F (36.8 C) 98.3 F (36.8 C)  SpO2: 100% 100%     General: Awake, no distress.  CV:  Good peripheral perfusion.  Resp:  Normal effort.  Abd:  No  distention.  Other:  No midline spinal tenderness, she has some mild tenderness to her right gluteal region.  Full range of motion of extremities are intact, she has equal DP pulses bilaterally, no focal weakness or numbness, no saddle anesthesia.   ED Results / Procedures / Treatments   Labs (all labs ordered are listed, but only abnormal results are displayed) Labs Reviewed - No data to display   PROCEDURES:  Critical Care performed: No  Procedures   MEDICATIONS ORDERED IN ED: Medications  lidocaine (LIDODERM) 5 % 1 patch (1 patch Transdermal Patch Applied 07/17/23 2042)  acetaminophen (TYLENOL) tablet 1,000 mg (1,000 mg Oral Given 07/17/23 2042)  ketorolac (TORADOL) 30 MG/ML injection 15 mg (15 mg Intramuscular Given 07/17/23 2042)  cyclobenzaprine (FLEXERIL) tablet 10 mg (10 mg Oral Given 07/17/23 2042)     IMPRESSION / MDM / ASSESSMENT AND PLAN / ED COURSE  I reviewed the triage vital signs and the nursing notes.  Differential diagnosis includes, but is not limited to, sciatica, herniated disc, musculoskeletal pain, strain, considered but doubt cauda equina conus medullaris since she has no focal deficits, no saddle anesthesia or incontinence.  No cancer history, back instrumentation or surgery or IV drug use to consider mass or epidural abscess or hematoma.  Extensive discussion done with patient and husband, will give her Toradol shot here, Tylenol, Lidoderm patch.  Flexeril.  Will reassess.  Discussed that if symptoms improve, do can discharge her with a course of Flexeril, Lidoderm patch.  She can also try Salonpas outpatient.  Also recommended that she follow-up with a primary care doctor to get an outpatient MRI if symptoms persist.  Shared decision making done with patient and she is agreeable with this plan.  Patient's presentation is most consistent with acute presentation with potential threat to life or bodily function.  On reassessment  patient is feeling significant better after pain meds.  Will send her home with a prescription for Flexeril, Lidoderm patch.  Also told her that she can use over-the-counter Salonpas if the Lidoderm patches are not working.  Tylenol ibuprofen as needed for pain.  Instructed her to follow-up with her primary care doctor for further management, she may need an outpatient MRI.  Shared decision making done with patient and she is agreeable with the plan.  Considered but no indication for further workup or inpatient admission at this time, she is safe for outpatient management.  Discharged with strict return precautions.      FINAL CLINICAL IMPRESSION(S) / ED DIAGNOSES   Final diagnoses:  Sciatica of right side     Rx / DC Orders   ED Discharge Orders          Ordered    cyclobenzaprine (FLEXERIL) 10 MG tablet  3 times daily PRN        07/17/23 2142    lidocaine (LIDODERM) 5 %  Every 24 hours        07/17/23 2142             Note:  This document was prepared using Dragon voice recognition software and may include unintentional dictation errors.    Claybon Jabs, MD 07/17/23 [phone]

## 2023-07-17 NOTE — ED Triage Notes (Signed)
 Pt here with left side hip pain radiating down her leg. Pt states she was here recently for same. Pt states she works from home and the pain is severe. Pt is still able to ambulate.
